# Patient Record
Sex: Female | Born: 1935 | Race: White | Hispanic: No | Smoking: Former smoker
Health system: Southern US, Community
[De-identification: ages and names within clinical notes are randomized; demographics above are authoritative.]

## PROBLEM LIST (undated history)

## (undated) DIAGNOSIS — R001 Bradycardia, unspecified: Secondary | ICD-10-CM

## (undated) DIAGNOSIS — I099 Rheumatic heart disease, unspecified: Secondary | ICD-10-CM

## (undated) DIAGNOSIS — M889 Osteitis deformans of unspecified bone: Secondary | ICD-10-CM

## (undated) DIAGNOSIS — E876 Hypokalemia: Secondary | ICD-10-CM

## (undated) DIAGNOSIS — H9313 Tinnitus, bilateral: Secondary | ICD-10-CM

## (undated) DIAGNOSIS — E785 Hyperlipidemia, unspecified: Secondary | ICD-10-CM

## (undated) DIAGNOSIS — I350 Nonrheumatic aortic (valve) stenosis: Secondary | ICD-10-CM

## (undated) DIAGNOSIS — D329 Benign neoplasm of meninges, unspecified: Secondary | ICD-10-CM

## (undated) DIAGNOSIS — F329 Major depressive disorder, single episode, unspecified: Secondary | ICD-10-CM

## (undated) DIAGNOSIS — I10 Essential (primary) hypertension: Secondary | ICD-10-CM

## (undated) DIAGNOSIS — I4891 Unspecified atrial fibrillation: Secondary | ICD-10-CM

## (undated) DIAGNOSIS — F32A Depression, unspecified: Secondary | ICD-10-CM

## (undated) HISTORY — DX: Unspecified atrial fibrillation: I48.91

## (undated) HISTORY — PX: OTHER SURGICAL HISTORY: SHX169

## (undated) HISTORY — DX: Tinnitus, bilateral: H93.13

## (undated) HISTORY — PX: KNEE ARTHROSCOPY: SUR90

## (undated) HISTORY — PX: HIP SURGERY: SHX245

## (undated) HISTORY — DX: Hypokalemia: E87.6

## (undated) HISTORY — PX: PACEMAKER INSERTION: SHX728

## (undated) HISTORY — DX: Osteitis deformans of unspecified bone: M88.9

## (undated) HISTORY — DX: Major depressive disorder, single episode, unspecified: F32.9

## (undated) HISTORY — PX: ABDOMINAL HYSTERECTOMY: SHX81

## (undated) HISTORY — DX: Nonrheumatic aortic (valve) stenosis: I35.0

## (undated) HISTORY — DX: Bradycardia, unspecified: R00.1

## (undated) HISTORY — PX: CHOLECYSTECTOMY: SHX55

## (undated) HISTORY — DX: Rheumatic heart disease, unspecified: I09.9

## (undated) HISTORY — DX: Benign neoplasm of meninges, unspecified: D32.9

## (undated) HISTORY — DX: Depression, unspecified: F32.A

## (undated) HISTORY — DX: Hyperlipidemia, unspecified: E78.5

## (undated) HISTORY — DX: Essential (primary) hypertension: I10

---

## 1994-09-16 DIAGNOSIS — I099 Rheumatic heart disease, unspecified: Secondary | ICD-10-CM

## 1994-09-16 HISTORY — DX: Rheumatic heart disease, unspecified: I09.9

## 1994-09-16 HISTORY — PX: MITRAL VALVE REPLACEMENT: SHX147

## 1998-05-18 ENCOUNTER — Ambulatory Visit (HOSPITAL_COMMUNITY): Admission: RE | Admit: 1998-05-18 | Discharge: 1998-05-18 | Payer: Self-pay | Admitting: Cardiology

## 1999-11-23 ENCOUNTER — Ambulatory Visit (HOSPITAL_COMMUNITY): Admission: RE | Admit: 1999-11-23 | Discharge: 1999-11-23 | Payer: Self-pay | Admitting: Cardiology

## 2001-03-19 ENCOUNTER — Ambulatory Visit (HOSPITAL_COMMUNITY): Admission: RE | Admit: 2001-03-19 | Discharge: 2001-03-19 | Payer: Self-pay | Admitting: Family Medicine

## 2001-03-19 ENCOUNTER — Encounter: Payer: Self-pay | Admitting: Family Medicine

## 2001-04-27 ENCOUNTER — Encounter: Admission: RE | Admit: 2001-04-27 | Discharge: 2001-05-28 | Payer: Self-pay | Admitting: Neurosurgery

## 2001-07-13 ENCOUNTER — Inpatient Hospital Stay (HOSPITAL_COMMUNITY): Admission: EM | Admit: 2001-07-13 | Discharge: 2001-07-20 | Payer: Self-pay | Admitting: Emergency Medicine

## 2001-07-13 ENCOUNTER — Encounter: Payer: Self-pay | Admitting: Family Medicine

## 2001-07-15 ENCOUNTER — Encounter: Payer: Self-pay | Admitting: Family Medicine

## 2001-07-22 ENCOUNTER — Encounter: Admission: RE | Admit: 2001-07-22 | Discharge: 2001-07-22 | Payer: Self-pay | Admitting: Family Medicine

## 2002-01-11 ENCOUNTER — Ambulatory Visit (HOSPITAL_COMMUNITY): Admission: RE | Admit: 2002-01-11 | Discharge: 2002-01-11 | Payer: Self-pay | Admitting: Cardiology

## 2002-05-28 ENCOUNTER — Ambulatory Visit (HOSPITAL_COMMUNITY): Admission: RE | Admit: 2002-05-28 | Discharge: 2002-05-29 | Payer: Self-pay | Admitting: Cardiology

## 2002-05-29 ENCOUNTER — Encounter: Payer: Self-pay | Admitting: Emergency Medicine

## 2003-03-09 ENCOUNTER — Inpatient Hospital Stay (HOSPITAL_COMMUNITY): Admission: EM | Admit: 2003-03-09 | Discharge: 2003-03-14 | Payer: Self-pay | Admitting: Emergency Medicine

## 2003-04-23 ENCOUNTER — Emergency Department (HOSPITAL_COMMUNITY): Admission: EM | Admit: 2003-04-23 | Discharge: 2003-04-24 | Payer: Self-pay | Admitting: Emergency Medicine

## 2003-05-02 ENCOUNTER — Encounter: Admission: RE | Admit: 2003-05-02 | Discharge: 2003-05-31 | Payer: Self-pay | Admitting: Orthopaedic Surgery

## 2003-11-04 ENCOUNTER — Emergency Department (HOSPITAL_COMMUNITY): Admission: EM | Admit: 2003-11-04 | Discharge: 2003-11-05 | Payer: Self-pay | Admitting: Emergency Medicine

## 2004-07-18 ENCOUNTER — Ambulatory Visit (HOSPITAL_COMMUNITY): Admission: RE | Admit: 2004-07-18 | Discharge: 2004-07-18 | Payer: Self-pay | Admitting: Neurosurgery

## 2004-10-05 ENCOUNTER — Ambulatory Visit (HOSPITAL_COMMUNITY): Admission: RE | Admit: 2004-10-05 | Discharge: 2004-10-05 | Payer: Self-pay | Admitting: Neurosurgery

## 2004-10-10 ENCOUNTER — Inpatient Hospital Stay (HOSPITAL_COMMUNITY): Admission: RE | Admit: 2004-10-10 | Discharge: 2004-10-12 | Payer: Self-pay | Admitting: Neurosurgery

## 2005-03-14 ENCOUNTER — Encounter: Admission: RE | Admit: 2005-03-14 | Discharge: 2005-04-10 | Payer: Self-pay | Admitting: Neurosurgery

## 2005-04-11 ENCOUNTER — Encounter: Admission: RE | Admit: 2005-04-11 | Discharge: 2005-04-26 | Payer: Self-pay | Admitting: Neurosurgery

## 2006-12-22 ENCOUNTER — Encounter: Admission: RE | Admit: 2006-12-22 | Discharge: 2006-12-22 | Payer: Self-pay | Admitting: Neurology

## 2008-06-10 ENCOUNTER — Encounter: Admission: RE | Admit: 2008-06-10 | Discharge: 2008-06-10 | Payer: Self-pay | Admitting: Neurology

## 2009-10-17 ENCOUNTER — Ambulatory Visit: Payer: Self-pay | Admitting: Cardiology

## 2009-11-03 ENCOUNTER — Ambulatory Visit: Payer: Self-pay | Admitting: Cardiology

## 2009-11-17 ENCOUNTER — Ambulatory Visit: Payer: Self-pay | Admitting: Cardiology

## 2009-11-22 ENCOUNTER — Ambulatory Visit: Payer: Self-pay | Admitting: Cardiology

## 2009-11-30 ENCOUNTER — Ambulatory Visit: Payer: Self-pay | Admitting: Cardiology

## 2009-12-15 ENCOUNTER — Ambulatory Visit: Payer: Self-pay | Admitting: *Deleted

## 2009-12-27 ENCOUNTER — Ambulatory Visit: Payer: Self-pay | Admitting: Cardiology

## 2010-01-12 ENCOUNTER — Ambulatory Visit: Payer: Self-pay | Admitting: Cardiology

## 2010-01-12 ENCOUNTER — Encounter: Admission: RE | Admit: 2010-01-12 | Discharge: 2010-01-12 | Payer: Self-pay | Admitting: Endocrinology

## 2010-01-14 ENCOUNTER — Encounter: Payer: Self-pay | Admitting: Internal Medicine

## 2010-01-15 ENCOUNTER — Ambulatory Visit: Payer: Self-pay | Admitting: Internal Medicine

## 2010-01-26 ENCOUNTER — Ambulatory Visit: Payer: Self-pay | Admitting: Cardiology

## 2010-01-31 ENCOUNTER — Encounter: Payer: Self-pay | Admitting: Internal Medicine

## 2010-02-09 ENCOUNTER — Ambulatory Visit: Payer: Self-pay | Admitting: Cardiovascular Disease

## 2010-02-26 ENCOUNTER — Ambulatory Visit: Payer: Self-pay | Admitting: Cardiology

## 2010-03-03 ENCOUNTER — Encounter: Payer: Self-pay | Admitting: Internal Medicine

## 2010-03-29 ENCOUNTER — Ambulatory Visit: Payer: Self-pay | Admitting: Cardiology

## 2010-04-06 ENCOUNTER — Ambulatory Visit: Payer: Self-pay | Admitting: Cardiology

## 2010-04-08 ENCOUNTER — Encounter: Payer: Self-pay | Admitting: Neurosurgery

## 2010-04-11 ENCOUNTER — Ambulatory Visit: Payer: Self-pay | Admitting: Cardiology

## 2010-04-19 NOTE — Letter (Signed)
Summary: Remote Device Check  [facility], Main Office  [address]   [place], [place] [address]   Phone: [phone]  Fax: [phone]     January 31, 2010 MRN: [id number]   ARIANIS BOWDITCH [address] [place], [place]  [address]   Dear Ms. KORMAN,   Your remote transmission was recieved and reviewed by your physician.  All diagnostics were within normal limits for you.   __X____Your next office visit is scheduled for:   3 months with Dr Johney Frame. Please call our office to schedule an appointment.    Sincerely,  Vella Kohler

## 2010-04-19 NOTE — Miscellaneous (Signed)
Summary: Device preload  Clinical Lists Changes  Observations: Added new observation of PPM INDICATN: A-fib (03/03/2010 11:54) Added new observation of MAGNET RTE: BOL 85 ERI 65 (03/03/2010 11:54) Added new observation of PPMLEADSTAT2: active (03/03/2010 11:54) Added new observation of PPMLEADSER2: 161096  (03/03/2010 11:54) Added new observation of PPMLEADMOD2: 4470  (03/03/2010 11:54) Added new observation of PPMLEADDOI2: 05/28/2002  (03/03/2010 11:54) Added new observation of PPMLEADLOC2: RV  (03/03/2010 11:54) Added new observation of PPMLEADSTAT1: active  (03/03/2010 11:54) Added new observation of PPMLEADSER1: 045409  (03/03/2010 11:54) Added new observation of PPMLEADMOD1: 4469  (03/03/2010 11:54) Added new observation of PPMLEADDOI1: 05/28/2002  (03/03/2010 11:54) Added new observation of PPMLEADLOC1: RA  (03/03/2010 11:54) Added new observation of PPM DOI: 05/28/2002  (03/03/2010 11:54) Added new observation of PPM SERL#: [id number]  (03/03/2010 11:54) Added new observation of PPM MODL#: [id number]  (03/03/2010 08:65) Added new observation of PACEMAKERMFG: Medtronic  (03/03/2010 11:54) Added new observation of PPM IMP MD: Duffy Rhody Tennant,MD  (03/03/2010 11:54) Added new observation of PPM REFER MD: Rolla Plate  (03/03/2010 11:54) Added new observation of PACEMAKER MD: Hillis Range, MD  (03/03/2010 11:54)      PPM Specifications Following MD:  Hillis Range, MD     Referring MD:  Rolla Plate PPM Vendor:  Medtronic     PPM Model Number:  [id number]     PPM Serial Number:  [id number] PPM DOI:  05/28/2002     PPM Implanting MD:  Rolla Plate  Lead 1    Location: RA     DOI: 05/28/2002     Model #: [id number]     Serial #: [id number]     Status: active Lead 2    Location: RV     DOI: 05/28/2002     Model #: [id number]     Serial #: [id number]     Status: active  Magnet Response Rate:  BOL 85 ERI 65  Indications:  A-fib

## 2010-04-19 NOTE — Cardiovascular Report (Signed)
Summary: Office Visit Remote   Office Visit Remote   Imported By: Roderic Ovens 01/31/2010 16:23:16  _____________________________________________________________________  External Attachment:    Type:   Image     Comment:   External Document

## 2010-04-23 ENCOUNTER — Other Ambulatory Visit (INDEPENDENT_AMBULATORY_CARE_PROVIDER_SITE_OTHER): Payer: Medicare Other

## 2010-04-23 DIAGNOSIS — I4891 Unspecified atrial fibrillation: Secondary | ICD-10-CM

## 2010-04-23 DIAGNOSIS — Z7901 Long term (current) use of anticoagulants: Secondary | ICD-10-CM

## 2010-04-26 DIAGNOSIS — I4891 Unspecified atrial fibrillation: Secondary | ICD-10-CM | POA: Insufficient documentation

## 2010-04-26 DIAGNOSIS — E785 Hyperlipidemia, unspecified: Secondary | ICD-10-CM | POA: Insufficient documentation

## 2010-04-26 DIAGNOSIS — Z950 Presence of cardiac pacemaker: Secondary | ICD-10-CM | POA: Insufficient documentation

## 2010-04-27 ENCOUNTER — Encounter: Payer: Self-pay | Admitting: Internal Medicine

## 2010-05-02 ENCOUNTER — Encounter (INDEPENDENT_AMBULATORY_CARE_PROVIDER_SITE_OTHER): Payer: Self-pay | Admitting: *Deleted

## 2010-05-04 ENCOUNTER — Ambulatory Visit: Payer: Medicare Other | Admitting: *Deleted

## 2010-05-09 ENCOUNTER — Ambulatory Visit (INDEPENDENT_AMBULATORY_CARE_PROVIDER_SITE_OTHER): Payer: Medicare Other | Admitting: *Deleted

## 2010-05-09 DIAGNOSIS — Z7901 Long term (current) use of anticoagulants: Secondary | ICD-10-CM

## 2010-05-09 DIAGNOSIS — I359 Nonrheumatic aortic valve disorder, unspecified: Secondary | ICD-10-CM

## 2010-05-09 DIAGNOSIS — I4891 Unspecified atrial fibrillation: Secondary | ICD-10-CM

## 2010-05-09 DIAGNOSIS — I059 Rheumatic mitral valve disease, unspecified: Secondary | ICD-10-CM

## 2010-05-09 NOTE — Letter (Signed)
Summary: Appointment - Missed  [facility], Main Office  [address]   [place], [place] [address]   Phone: [phone]  Fax: [phone]     May 02, 2010 MRN: [id number]   SANSA ALKEMA [address] [place], [place]  [address]   Dear Ms. LUEVANOS,  Our records indicate you missed your appointment on  04-27-10  with Dr.  Johney Frame.                                    It is very important that we reach you to reschedule this appointment. We look forward to participating in your health care needs. Please contact us at the number listed above at your earliest convenience to reschedule this appointment.     Sincerely,    Glass blower/designer

## 2010-05-25 ENCOUNTER — Ambulatory Visit (HOSPITAL_COMMUNITY): Payer: Medicare Other | Attending: Cardiology

## 2010-05-25 ENCOUNTER — Ambulatory Visit (INDEPENDENT_AMBULATORY_CARE_PROVIDER_SITE_OTHER): Payer: Medicare Other | Admitting: Nurse Practitioner

## 2010-05-25 DIAGNOSIS — I359 Nonrheumatic aortic valve disorder, unspecified: Secondary | ICD-10-CM | POA: Insufficient documentation

## 2010-05-25 DIAGNOSIS — Z7901 Long term (current) use of anticoagulants: Secondary | ICD-10-CM

## 2010-05-25 DIAGNOSIS — I4891 Unspecified atrial fibrillation: Secondary | ICD-10-CM

## 2010-05-25 DIAGNOSIS — I10 Essential (primary) hypertension: Secondary | ICD-10-CM | POA: Insufficient documentation

## 2010-05-25 DIAGNOSIS — Z950 Presence of cardiac pacemaker: Secondary | ICD-10-CM | POA: Insufficient documentation

## 2010-05-25 DIAGNOSIS — Z954 Presence of other heart-valve replacement: Secondary | ICD-10-CM | POA: Insufficient documentation

## 2010-06-08 ENCOUNTER — Other Ambulatory Visit (INDEPENDENT_AMBULATORY_CARE_PROVIDER_SITE_OTHER): Payer: Medicare Other | Admitting: *Deleted

## 2010-06-08 DIAGNOSIS — I059 Rheumatic mitral valve disease, unspecified: Secondary | ICD-10-CM | POA: Insufficient documentation

## 2010-06-08 DIAGNOSIS — Z7901 Long term (current) use of anticoagulants: Secondary | ICD-10-CM

## 2010-06-08 DIAGNOSIS — I4891 Unspecified atrial fibrillation: Secondary | ICD-10-CM

## 2010-06-20 ENCOUNTER — Encounter: Payer: Medicare Other | Admitting: *Deleted

## 2010-06-25 ENCOUNTER — Encounter: Payer: Medicare Other | Admitting: *Deleted

## 2010-06-26 ENCOUNTER — Ambulatory Visit (INDEPENDENT_AMBULATORY_CARE_PROVIDER_SITE_OTHER): Payer: Medicare Other | Admitting: *Deleted

## 2010-06-26 ENCOUNTER — Other Ambulatory Visit: Payer: Medicare Other | Admitting: *Deleted

## 2010-06-26 DIAGNOSIS — I059 Rheumatic mitral valve disease, unspecified: Secondary | ICD-10-CM

## 2010-06-26 DIAGNOSIS — Z7901 Long term (current) use of anticoagulants: Secondary | ICD-10-CM

## 2010-06-26 DIAGNOSIS — Z954 Presence of other heart-valve replacement: Secondary | ICD-10-CM

## 2010-07-12 ENCOUNTER — Encounter: Payer: Medicare Other | Admitting: Internal Medicine

## 2010-07-13 ENCOUNTER — Other Ambulatory Visit: Payer: Self-pay | Admitting: *Deleted

## 2010-07-13 DIAGNOSIS — I4891 Unspecified atrial fibrillation: Secondary | ICD-10-CM

## 2010-07-13 MED ORDER — DIGOXIN 125 MCG PO TABS: 125.0000 ug | ORAL_TABLET | Freq: Every day | ORAL | Status: DC

## 2010-07-18 ENCOUNTER — Encounter: Payer: Self-pay | Admitting: Internal Medicine

## 2010-07-18 ENCOUNTER — Telehealth: Payer: Self-pay | Admitting: Cardiology

## 2010-07-18 NOTE — Telephone Encounter (Signed)
Has a question about her meds

## 2010-07-18 NOTE — Telephone Encounter (Signed)
Pt was given Digoxin not Lanoxin from pharmacy and would like to know if it is ok to take.  RN notified pt it is ok to take Digoxin in the place of Lanoxin.  Pt verbalized to RN understanding of instructions.

## 2010-07-20 ENCOUNTER — Encounter: Payer: Self-pay | Admitting: Internal Medicine

## 2010-07-20 ENCOUNTER — Ambulatory Visit (INDEPENDENT_AMBULATORY_CARE_PROVIDER_SITE_OTHER): Payer: Medicare Other | Admitting: Internal Medicine

## 2010-07-20 ENCOUNTER — Ambulatory Visit: Payer: Self-pay | Admitting: *Deleted

## 2010-07-20 ENCOUNTER — Ambulatory Visit (INDEPENDENT_AMBULATORY_CARE_PROVIDER_SITE_OTHER): Payer: Medicare Other | Admitting: *Deleted

## 2010-07-20 ENCOUNTER — Encounter: Payer: Medicare Other | Admitting: Internal Medicine

## 2010-07-20 VITALS — BP 102/62 | HR 64 | Ht 67.0 in | Wt 187.0 lb

## 2010-07-20 DIAGNOSIS — E785 Hyperlipidemia, unspecified: Secondary | ICD-10-CM

## 2010-07-20 DIAGNOSIS — R001 Bradycardia, unspecified: Secondary | ICD-10-CM

## 2010-07-20 DIAGNOSIS — Z954 Presence of other heart-valve replacement: Secondary | ICD-10-CM

## 2010-07-20 DIAGNOSIS — I4891 Unspecified atrial fibrillation: Secondary | ICD-10-CM

## 2010-07-20 DIAGNOSIS — I498 Other specified cardiac arrhythmias: Secondary | ICD-10-CM

## 2010-07-20 DIAGNOSIS — I059 Rheumatic mitral valve disease, unspecified: Secondary | ICD-10-CM

## 2010-07-20 DIAGNOSIS — Z7901 Long term (current) use of anticoagulants: Secondary | ICD-10-CM

## 2010-07-20 NOTE — Assessment & Plan Note (Signed)
Continue longterm anticoagulation with coumadin No changes today

## 2010-07-20 NOTE — Progress Notes (Signed)
Annette Short is a pleasant 77 y.o. yo patient with a h/o rheumatic heart disease s/p MVR (mechanical #33 St Jude valve) 1996, permanent atrial fibrillation, and bradycardia sp PPM (MDT) by Dr Deborah Chalk  who presents today to establish care in the Electrophysiology device clinic.   The patient reports doing very well since having a pacemaker implanted and remains very active despite her age.   Today, she  denies symptoms of palpitations, chest pain, shortness of breath, orthopnea, PND, lower extremity edema, dizziness, presyncope, syncope, or neurologic sequela.  The patientis tolerating medications without difficulties and is otherwise without complaint today.   Past Medical History  Diagnosis Date  . Mild aortic stenosis     AVA 1.1cm2 by echo 12/10  . Atrial fibrillation     permanent  . Depression   . Hyperlipidemia   . Rheumatic heart disease 09/1994    s/p mechanical mitral valve repair (#33 St Jude valve)  . Tinnitus of both ears   . Paget's bone disease     skull  . Hypertension   . Meningioma   . Bradycardia     s/p PPM    Past Surgical History  Procedure Date  . Pacemaker insertion   . Right ankle surgery     with pin and plate  . Abdominal hysterectomy     total  . Cholecystectomy   . Knee arthroscopy     total  . Mitral valve replacement 09/1994    #33 St Jude valve    History   Social History  . Marital Status: Widowed    Spouse Name: N/A    Number of Children: N/A  . Years of Education: N/A   Occupational History  . Not on file.   Social History Main Topics  . Smoking status: Never Smoker   . Smokeless tobacco: Not on file  . Alcohol Use: No  . Drug Use: No  . Sexually Active: Not on file   Other Topics Concern  . Not on file   Social History Narrative   widow    No family history on file.  Allergies  Allergen Reactions  . Penicillins   . Warfarin And Related     Current Outpatient Prescriptions  Medication Sig Dispense Refill  .  digoxin (LANOXIN) 0.125 MG tablet Take 1 tablet (125 mcg total) by mouth daily.  30 tablet  6  . FUROSEMIDE PO Take by mouth.        Marland Kitchen KLOR-CON M10 10 MEQ tablet daily.      . metoprolol (TOPROL-XL) 50 MG 24 hr tablet daily.      Marland Kitchen PARoxetine (PAXIL) 40 MG tablet Take 40 mg by mouth every morning.        . warfarin (COUMADIN) 5 MG tablet Take 5 mg by mouth as directed.        Marland Kitchen DISCONTD: Potassium (POTASSIMIN PO) Take by mouth.          ROS- all systems are reviewed and negative except as per HPI  Physical Exam: Filed Vitals:   07/20/10 1006  BP: 102/62  Pulse: 64  Height: 5\' 7"  (1.702 m)  Weight: 187 lb (84.823 kg)    GEN- The patient is elderly appearing, alert and oriented x 3 today.   Head- normocephalic, atraumatic Eyes-  Sclera clear, conjunctiva pink Ears- hearing intact Oropharynx- clear Neck- supple, no JVP Lymph- no cervical lymphadenopathy Lungs- Clear to ausculation bilaterally, normal work of breathing Chest- pacemaker pocket is well healed Heart-  Regular rate and rhythm, mechanical S1, no murmurs, rubs or gallops, PMI not laterally displaced GI- soft, NT, ND, + BS Extremities- no clubbing, cyanosis, or edema MS- no significant deformity or atrophy Skin- no rash or lesion Psych- euthymic mood, full affect Neuro- strength and sensation are intact  Pacemaker interrogation- reviewed in detail today,  See PACEART report  Assessment and Plan:

## 2010-07-20 NOTE — Patient Instructions (Signed)
Your physician wants you to follow-up in: 12 months in [place] office You will receive a reminder letter in the mail two months in advance. If you don't receive a letter, please call our office to schedule the follow-up appointment.

## 2010-07-20 NOTE — Assessment & Plan Note (Signed)
Normal pacemaker function See Pace Art report No changes today  

## 2010-07-20 NOTE — Assessment & Plan Note (Signed)
Stable No change required today  

## 2010-07-20 NOTE — Assessment & Plan Note (Signed)
Doing well s/p MVR (mechanical) Continue lifelong anticoagulation with coumadin.

## 2010-07-23 ENCOUNTER — Telehealth: Payer: Self-pay | Admitting: Cardiology

## 2010-07-23 NOTE — Telephone Encounter (Signed)
Patient needs dosage information for coumadin.  Please call

## 2010-07-23 NOTE — Telephone Encounter (Signed)
Coumadin dosage discussed with patient

## 2010-08-03 ENCOUNTER — Ambulatory Visit (INDEPENDENT_AMBULATORY_CARE_PROVIDER_SITE_OTHER): Payer: Medicare Other | Admitting: *Deleted

## 2010-08-03 DIAGNOSIS — Z954 Presence of other heart-valve replacement: Secondary | ICD-10-CM

## 2010-08-03 DIAGNOSIS — Z7901 Long term (current) use of anticoagulants: Secondary | ICD-10-CM

## 2010-08-03 DIAGNOSIS — I059 Rheumatic mitral valve disease, unspecified: Secondary | ICD-10-CM

## 2010-08-03 LAB — POCT INR: INR: 4.5

## 2010-08-03 NOTE — Op Note (Signed)
[place]  Patient:    Annette Short, Annette Short Visit Number: [id number] MRN: [id number]          Service Type: MED Location: [id number] Attending Physician:  McDiarmid, Leighton Roach. Dictated by:   Lubertha Basque Jerl Santos, M.D. Proc. Date: 07/16/01 Admit Date:  07/13/2001                             Operative Report  PREOPERATIVE DIAGNOSIS:  Left knee infected prepatellar bursa.  POSTOPERATIVE DIAGNOSIS:  Left knee infected prepatellar bursa.  PROCEDURE:  Left knee irrigation and debridement, prepatellar bursa.  ANESTHESIA:  General.  SURGEON:  Lubertha Basque. Jerl Santos, M.D.  ASSISTANT:  Prince Rome, P.A.  INDICATION FOR PROCEDURE:  The patient is a 77 year old woman on Coumadin for a valve replacement.  Several days ago she fell directly on her knee and experienced massive swelling of her knee.  She was noted to have an extremely elevated INR.  She was subsequently admitted to the medicine service with a very swollen knee.  There was some concern about an infection in the knee joint.  An attempted aspiration was made, and 2 cc of blood were returned. She has persisted with pain on the anterior aspect of her knee despite rest, elevation, and IV Levaquin antibiotic.  At this point she is offered incision and drainage with operative removal of the bursa and any fluid.  The procedure was discussed with the patient, and informed operative consent was obtained after discussion of the possible complications of, reaction to anesthesia, and infection.  DESCRIPTION OF PROCEDURE:  The patient was taken to the operating suite, where a general anesthetic was applied without difficulty.  She was positioned supine and prepped and draped in the normal sterile fashion.  After administration of preop IV Ancef, her surgery was performed.  She was given a test dose of this medicine with no reaction, then given the entire dose with no problems.  No tourniquet was used  during the place; one was placed but never inflated.  A longitudinal incision was made over the prepatellar area. She had a very large collection of clotted blood.  Perhaps a cup of blood was removed.  No real pus was encountered, but cultures were taken and sent to the lab.  A thorough irrigation and debridement was done.  We used the pulsatile lavage as well.  She did not have much in the way of active bleeding occurring, but the Bovie was used to control any small bleeders.  I used some Prolene sutures to go through the skin and into the tissues in the retinaculum and then back through the skin in order to reapproximate the tissue and eliminate any dead space.  I placed five or six of these around the prepatellar area.  I then placed a Penrose drain and closed the incision with Prolene.  Adaptic was applied to the wound, followed by dry gauze and a loose Ace wrap.  Estimated blood loss and intraoperative fluids can be obtained from the anesthesia records.  DISPOSITION:  The patient was extubated in the operating room and taken to the recovery room in stable condition.  Plans were for her to be admitted back to the medicine service, who will monitor her closely on IV Kefzol, and she may restart her Coumadin immediately. Dictated by:   Lubertha Basque Jerl Santos, M.D. Attending Physician:  Acquanetta Belling D. DD:  07/16/01 TD:  07/17/01 Job:  [id number]

## 2010-08-03 NOTE — Discharge Summary (Signed)
NAME:  Annette Short, Annette Short                          ACCOUNT NO.:  [id number]   MEDICAL RECORD NO.:  [id number]                   PATIENT TYPE:  INP   LOCATION:  5004                                 FACILITY:  [facility]   PHYSICIAN:  Lubertha Basque. Jerl Santos, M.D.             DATE OF BIRTH:  1933/05/26   DATE OF ADMISSION:  03/09/2003  DATE OF DISCHARGE:  03/14/2003                                 DISCHARGE SUMMARY   ADMISSION DIAGNOSES:  1. Right ankle trimalleolar fracture.  2. History of coronary bypass.  3. History of pacemaker.   DISCHARGE DIAGNOSES:  1. Right ankle trimalleolar fracture.  2. History of coronary bypass.  3. History of pacemaker.   PROCEDURE PERFORMED:  Right ankle ORIF.   BRIEF HISTORY:  [name] is a 77 year old female who had a knee operation  a few years ago but this time she had fallen on the day of admission to the  hospital. She was having difficulty walking on her right ankle with  deformity and pain.  She was transported to the emergency room at which time  x-rays revealed a trimalleolar ankle fracture.  We discussed treatment  options with the patient, those being open reduction internal fixation.   PERTINENT LABORATORY AND X-RAY FINDINGS:  She had a trimalleolar ankle  fracture on the right side on x-ray.  Chest with cardiomegaly but no active  disease.  Pacer electrodes were noted.  Lab work, last testing, WBC is 2.94,  hemoglobin 9.1, hematocrit 26.7. INR 2.0.  Pro time 19.1.  Blood was  replaced as necessary during her hospital stay.   HOSPITAL COURSE:  She was admitted and placed on her normal medications  which are Coumadin, Paxil, Cozaar, Lasix, K-Dur, Lanoxin, Zetia, Zantac,  Toprol and postoperative we used ice and elevation.  She was given  appropriate pain medications, IV and p.o.  IV Ancef 1 gram q.8h. x3 doses.  Physical therapy was ordered and eventually an OT consult was also ordered  to be touch down to nonweight bearing on the right side.   Pharmacy was  helpful in using heparin for anticoagulation and then getting her back on  her Coumadin.  Dressing was changed and she was placed into a CAM boot. The  wound was noted to be benign and no sign of irritation or infection and she  was discharged home.   CONDITION ON DISCHARGE:  Improved.   FOLLOW UP:  She will remain on Vicodin for pain, one or two q.4-6h. p.r.n.  discomfort, Coumadin 5 mg take two tablets daily and contact cardiologist to  adjust the dose. She is also left on her home medications which are Paxil 40  mg q.a.m.,  Cozaar 50 mg q.d.,  Lasix 40 mg q.d.,  K-Dur 10 mEq q.d.,  Lanoxin 0.125 mg q.d.,  Zetia 10 mg q.d.,  Zantac 150 mg b.i.d., Toprol XL  q.d.  She is to be nonweight  bearing on her right foot with a walker,  crutches or wheelchair at all times.  The boot should remain on.  They can  change the dressing every other day to every three  days.  We will see her back in our office in seven to 10 days.  Call [phone] for an appointment.  Her diet is unrestricted.  Any signs of increasing  temperature above normal, fever or redness, she is to call and we would see  her in our office immediately.      Lindwood Qua, P.A.                    Lubertha Basque Jerl Santos, M.D.    MC/MEDQ  D:  04/19/2003  T:  04/19/2003  Job:  [id number]

## 2010-08-03 NOTE — H&P (Signed)
NAMEALEXINA, Annette Short                ACCOUNT NO.:  [id number]   MEDICAL RECORD NO.:  [id number]          PATIENT TYPE:  INP   LOCATION:  2899                         FACILITY:  [facility]   PHYSICIAN:  Hilda Lias, M.D.   DATE OF BIRTH:  1933-07-04   DATE OF ADMISSION:  10/10/2004  DATE OF DISCHARGE:                                HISTORY & PHYSICAL   HISTORY:  Ms. Boakye is a lady who has been complaining of back pain with  radiation down to the leg for many years.  This lady eventually was seen by  me initially in 2003.  The pain is getting worse.  She feels that she has  had more pain and weakness in the left leg, and she is quite miserable.  She  denies any pain in the right leg.  She has an x-ray and was sent to [place] for  an evaluation.   PAST MEDICAL HISTORY:  1.  Cholecystectomy.  2.  Hysterectomy.  3.  Mitral valve replacement.  4.  Knee surgery.  5.  A pacemaker.  6.  Surgery of the right foot.   ALLERGIES:  PENICILLIN.   REVIEW OF SYSTEMS:  Positive for back and left leg pain.   PHYSICAL EXAMINATION:  HEENT:  Normal.  NECK:  Normal.  LUNGS:  Clear.  HEART:  There is a pacemaker plus a midline scar.  ABDOMEN:  Normal.  EXTREMITIES:  Grade 1 edema.  NEUROLOGIC:  Mental status normal.  Cranial nerves normal.  Strength is 5/5  except in the left leg where she has weakness of dorsiflexion on the left  foot with numbness.   The MRI showed that she has a herniated disk at the level of L4-5,  compromising the L5 nerve root, as well as facet arthropathy on the left at  the level of L5-S1.   CLINICAL IMPRESSION:  1.  Left L4-L5 herniated disk.  2.  Left L5-S1 stenosis.   RECOMMENDATIONS:  The patient is being admitted for surgery.  The procedure  will be a left L4-5 and L5-S1 diskectomy with foraminotomy.  She knows about  the risks such as infection, CSF leak, worsening of the pain, paralysis, and  all the risks associated with her being off of Coumadin.       EB/MEDQ  D:  10/10/2004  T:  10/10/2004  Job:  [id number]

## 2010-08-03 NOTE — H&P (Signed)
[place]. [facility]  Patient:    ARLISA, LECLERE Visit Number: [id number] MRN: [id number]          Service Type: MED Location: [phone] Attending Physician:  McDiarmid, Leighton Roach. Dictated by:   Michell Heinrich, M.D. Admit Date:  07/13/2001                           History and Physical  CHIEF COMPLAINT: Left leg pain.  HISTORY OF PRESENT ILLNESS: Ms. Hayter is a 77 year old white female, with an eight day history of left knee pain.  She had an fall about eight days ago and reportedly scraped her knee and had minimal swelling and discoloration of the knee at the time.  She was able to walk on it without difficulty.  She had some bruising and swelling over the area with development of erythema and warmth over the next few days, and went to her primary care physician.  At this visit she had a negative x-ray and her INR was found to be supratherapeutic (unknown number), and she was instructed to hold her Coumadin until day and she was not given any further medications.  The night prior to presentation she had increased swelling of her left foot and ankle and decided to come to the emergency department today.  PAST MEDICAL HISTORY:  1. Paroxysmal atrial fibrillation.  2. Mitral valve replacement (mitral/mechanical valve).  3. Neurologic symptoms.  Reports intermittent loss of balance and some     related nausea.  Reports that these have been related to a "benign" mass     in her brain, for which she sees Dr. Sandria Manly.  4. Low back pain.  She has a history of herniated nucleus pulposus but     has not had surgery.  She apparently sees Dr. Jeral Fruit.  PAST SURGICAL HISTORY:  1. Cardiac surgery, mitral valve replacement for rheumatic heart disease in     1996.  2. Hysterectomy/BSO.  3. Cholecystectomy.  MEDICATIONS:  1. Coumadin 5 mg p.o. q.d., which is her latest dosing.  2. Lanoxin 0.125 mg q.d.  3. Cozaar 50 mg q.d.  4. K-Dur 10 mEq p.o. q.d.  5.  Zantac 150 mg b.i.d. p.r.n.  6. Cordarone 200 mg p.o. q.d.  7. Lipitor 40 mg p.o. q.d.  8. Lasix 40 mg p.o. q.d.  9. Zetia 10 mg p.o. q.d. 10. Celebrex 200 mg p.o. q.d. 11. Paxil 40 mg p.o. q.d. 12. Vicodin 5/500 mg one to two tablets p.o. q.6h p.r.n. pain. 13. Phenergan 25 mg q.8h p.r.n.  ALLERGIES: PENICILLIN causes anaphylaxis.  SOCIAL HISTORY/HABITS: She is married and lives [place] her [place] in [place]  She is a retired Scientist, research (physical sciences).  She is independent of all of her activities of daily living but does not drive alone. She has an approximate ten-pack-year history of tobacco abuse but quit in the 1970s.  Denies alcohol or drug use.  FAMILY HISTORY: Mother died at age 36 of breast cancer.  Father died at age 82 with myocardial infarction.  She has a sister with type 2 diabetes.  REVIEW OF SYSTEMS: She has dyspnea on exertion with 30 feet of walking.  She has occasional palpitations.  No fever or chills, nausea or vomiting.  No pain in the contralateral leg.  PHYSICAL EXAMINATION:  VITAL SIGNS: Temperature 96.9 degrees, pulse 61, blood pressure 133/54, respirations 20.  O2 saturation 98% on room air.  GENERAL: No  acute distress, alert and oriented x4.  HEENT: PERRLA.  EOMI.  TMs within normal limits bilaterally.  No icterus or injection.  Oropharynx with pink moist mucosa.  She is edentulous and does not have any oral lesions.  NECK: Supple without lymphadenopathy or thyromegaly.  LUNGS: Clear to auscultation bilaterally.  Nonlabored respirations.  CARDIOVASCULAR: Regular rhythm and rate with a loud S1 click.  No murmurs, rubs, or gallops.  ABDOMEN: Soft, nontender, nondistended.  Bowel sounds normoactive.  No hepatosplenomegaly.  EXTREMITIES: Scattered varicosities with lipomatous-like masses on the upper extremities and lower extremities bilaterally.  Her left knee has a notable effusion and is erythematous and warm, with a superficial  abrasion present with some fluid drainage.  Warmth extends from the mid tibia to the upper thigh and she has diffuse nonpalpable purpura on the left lower extremity from the thigh to the ankle.  The left and right thighs both measure 56 cm.  The left calf is noticeably enlarged compared to the right, and there is 3+ pitting edema of the left lower extremity below the knee.  There is tenderness to palpation over most of the left lower extremity but exquisitely tender over the left knee cap.  LABORATORY DATA: Sodium 140, potassium 4.1, chloride 106, bicarbonate 28, BUN 10, creatinine 0.9, glucose 98, calcium 8.7.  WBC 6.6, hemoglobin 10.9, platelets 244,000; MCV 89.  INR is 2.0, PTT 46.  ASSESSMENT/PLAN: [age] year white female with recent fall and left knee abrasion, now with apparent cellulitis and questionable hemarthrosis versus monoarticular arthritis.  1. Left knee swelling.  Question whether there is cellulitis involved but     there definitely appears to be a knee effusion.  We tapped the knee in the     emergency department and this revealed 2-3 cc of dark blood without any     further fluid.  This fluid will be sent for culture.  We will also send     blood cultures and will need to begin prophylactic antibiotics until     further results obtained.  Will need to rule out deep vein thrombosis if     possible with lower extremity Dopplers.  Will consult orthopedics to get     their opinion on possible further surgical treatment for the knee.  2. Chronic anticoagulation.  Her INR is therapeutic now but with hemarthrosis     will hold further anticoagulants and see if she needs surgery.  3. History of fall.  Sounds like a legitimate accident, without any     neurologic precipitant, but could have been related to her intermittent     bouts of imbalance and nausea.  Will need to monitor and try to follow up     her neurologic work-up.  4. Cardiac.  Stable, regular rhythm.  No  electrocardiogram for now.  Will      continue outpatient medicines.  No telemetry for now.Dictated by:   Michell Heinrich, M.D. Attending Physician:  McDiarmid, Tawanna Cooler D. DD:  07/13/01 TD:  07/14/01 Job: [id number]

## 2010-08-03 NOTE — Op Note (Signed)
Annette Short, Annette Short                ACCOUNT NO.:  [id number]   MEDICAL RECORD NO.:  [id number]          PATIENT TYPE:  INP   LOCATION:  3010                         FACILITY:  [facility]   PHYSICIAN:  Hilda Lias, M.D.   DATE OF BIRTH:  1934/03/09   DATE OF PROCEDURE:  10/10/2004  DATE OF DISCHARGE:                                 OPERATIVE REPORT   PREOPERATIVE DIAGNOSIS:  Left L4-5 herniated disk, left L5-S1 stenosis,  foraminal, degenerative disk disease.   POSTOPERATIVE DIAGNOSIS:  Left L4-5 herniated disk, left L5-S1 stenosis,  foraminal, degenerative disk disease.   PROCEDURE:  Left L5 hemilaminectomy, left L4-5 diskectomy, left L5-S1  foraminotomy, microscopic.   SURGEON:  Hilda Lias, M.D.   ASSISTANT:  Danae Orleans. Venetia Maxon, M.D.   CLINICAL HISTORY:  Patient was admitted because of back and left leg pain.  X-rays show severe degenerative disk disease with a herniated disk, L4-5,  left, and L5-S1 _____.  The patient wanted to proceed with surgery because  she was not any better with conservative treatment.   PROCEDURE:  Patient was taken to the OR.  The back was prepped with  Betadine.  A midline incision from L4-S1 was made.  The muscle was retracted  laterally.  With the microscope, we drilled the hemilamina of L5 CDU.  There  was a thick yellow ligament which also was excised.  The S1 nerve root was  quite tight, and foraminotomy using Kerrison punch was accomplished.  The  thecal sac was retracted, and we visualized the L4-5, which was herniated.  An incision was made, and a large amount of degenerative disk was removed  medially and laterally.  From then on, the area was irrigated.  Fentanyl and  Depo-Medrol space, and the wound was closed with Vicryl and a Steri-Strip.       EB/MEDQ  D:  10/10/2004  T:  10/10/2004  Job:  [id number]

## 2010-08-03 NOTE — Op Note (Signed)
   NAME:  STORMIE, VENTOLA                          ACCOUNT NO.:  [id number]   MEDICAL RECORD NO.:  [id number]                   PATIENT TYPE:  OIB   LOCATION:  2020                                 FACILITY:  [facility]   PHYSICIAN:  Colleen Can. Deborah Chalk, M.D.            DATE OF BIRTH:  Aug 16, 1933   DATE OF PROCEDURE:  05/28/2002  DATE OF DISCHARGE:                                 OPERATIVE REPORT   PROCEDURE:  Implantation of a dual-chamber pulse generator under  fluoroscopy.   INDICATIONS FOR PROCEDURE:  Bradycardia with history of atrial fibrillation,  prosthetic mitral valve.   DESCRIPTION OF PROCEDURE:  The right subclavicular area was prepped and  draped. This area was infiltrated with 1% Xylocaine.  A subcutaneous pocket  was created through the prepectoral fascia.   Two punctures were made into the right subclavian vein over top of the first  rib.  The 7 French Cook introducers were used to introduce the atrial and  ventricular leads.  The ventricular lead was a Guidant l 4470 active  fixation lead, serial #[id number].  Ventricular thresholds were 0.2 volts to  capture at 0.4 mA current with 0.5 msec pulse width.  Impedance was 598  ohms, R waves were 21.7 mV.   The atrial lead was a Guidant 4469 active fixation lead, serial #[id number]  [id number].  Atrial thresholds were 1.3 volts to capture, 3.2 mA current with a  0.5 msec pulse width.  Impedance was 398 ohms and P waves were 3.1 mV.  The  leads were sutured in place. The wound was flushed with kanamycin solution.  The leads were connected to a Medtronic DDDR Kappa [id number] pacemaker, serial  [id number].  The unit was sewn in place.  The wound was closed with 2-0 and  subsequently 5-0 Dexon and Steri-Strips were applied.                                                 Colleen Can. Deborah Chalk, M.D.    SNT/MEDQ  D:  05/28/2002  T:  05/29/2002  Job:  [id number]

## 2010-08-03 NOTE — H&P (Signed)
NAME:  Annette Short, Annette Short NO.:  [id number]   MEDICAL RECORD NO.:  [id number]                   PATIENT TYPE:  OIB   LOCATION:                                       FACILITY:  [facility]   PHYSICIAN:  Colleen Can. Deborah Chalk, M.D.            DATE OF BIRTH:  1933-10-17   DATE OF ADMISSION:  05/28/2002  DATE OF DISCHARGE:                                HISTORY & PHYSICAL   CHIEF COMPLAINT:  Weakness and fatigue with subsequent documentation of  tachy-brady syndrome.   HISTORY OF PRESENT ILLNESS:  The patient is a very pleasant 77 year old  white female who has multiple medical problems. She presents with ongoing  problems with weakness and fatigue. She has had previous bouts of recurrent  atrial fibrillation and her amiodarone had been stopped in the latter part  of 2003. She has remained on chronic Coumadin. She has had a recent Holter  monitor which demonstrated slow sinus bradycardia. In light of  these  findings, she is now referred on for implantation of a permanent pacemaker  with plans to reinitiate anti-arrhythmic therapy. She has not had any  recurrent chest pain. Her shortness of breath has improved since being off  of amiodarone.   PAST MEDICAL HISTORY:  1. Severe mitral stenosis with mild mitral regurgitation, status post mitral     valve replacement in July 1996 by Dr. Kathlee Nations Trigt with a 33-mm St.     Jude valve.  2. Prior history of rheumatic heart disease.  3. Cardiomyopathy with cardiac catheterization in May 1996 showing normal     coronary arteries. Her last 2D echocardiogram was in November 2003 which     showed an estimated ejection fraction at 40% to 45%, atrial fibrillation,     left ventricular enlargement, akinetic septum, satisfactory functioning     of the prosthetic mitral valve, biatrial enlargement and trace tricuspid     regurgitation with mild pulmonic insufficiency.  4. Depression.  5. History of atrial fibrillation,  intolerant to quinidine and previously     maintained on amiodarone, however, stopped in the latter part of 2003.  6. Hypercholesteremia.  7. Chronic Coumadin therapy.  8. Tinnitus.  9. History of Paget's disease of the skull.  10.      History of meningioma.  11.      Obesity.  12.      Remote history of tobacco abuse.   ALLERGIES:  PENICILLIN WHICH CAUSES ANAPHYLAXIS.   CURRENT MEDICATIONS:  1. Potassium 10 mEq daily.  2. Celebrex 200 mg daily.  3. Hydrocodone p.r.n.  4. Lasix 40 mg a day.  5. Lipitor 40 mg a day.  6. Paxil 40 mg a day.  7. Lanoxin 0.125 daily.  8. Zantac p.r.n.  9. Coumadin 5 x4, 2.5 x3.  10.      Cozaar 50 mg a day.  11.  Toprol  XL 25 mg a day.   PAST SURGICAL HISTORY:  1. Status post cholecystectomy.  2. Status post hysterectomy.  3. Previous lipoma excision.   SOCIAL HISTORY:  There is no present alcohol or tobacco.   FAMILY HISTORY:  The mother died of breast cancer, father died with a heart  attack. She has had 1 sister who has also had cardiomyopathy and died in the  postoperative phase following valve surgery.   REVIEW OF SYSTEMS:  As noted above. Since being off of amiodarone her  shortness of breath has improved. She continues to have a chronic degree of  depression. She has had no reoccurrence of chest pain. She has had previous  bout of significant URI towards the first part of the year which has been  treated with antibiotics.   PHYSICAL EXAMINATION:  GENERAL:  She is a pleasant white female who appears  somewhat older than her stated age. Her mood is somewhat somber. Weight is  222 pounds.  VITAL SIGNS:  Blood pressure 130/60 sitting and standing, heart rate 44 and  regular, respirations 18, she is afebrile.  SKIN:  Warm and dry. Color is somewhat sallow.  LUNGS:  Clear.  HEART:  Shows a reasonably regular rhythm with a slow ventricular response.  ABDOMEN:  Soft, positive bowel sounds and obese.  EXTREMITIES:  Without edema.   NEUROLOGIC:  Intact, no gross focal deficits.   LABORATORY DATA:  Pending.   IMPRESSION:  1. Probable tachy-brady syndrome.  2. History of atrial fibrillation.  3. Known mitral valve disease, status post mitral valve replacement in 1996.  4. Chronic Coumadin.  5. Chronic depression.   PLAN:  Will proceed on with elective permanent pacemaker implantation. Will  try to maintain the patient in sinus rhythm if at all possible. May need to  reinitiate anti-arrhythmic therapy.      Juanell Fairly C. Earl Gala, N.P.                 Colleen Can. Deborah Chalk, M.D.    LCO/MEDQ  D:  05/24/2002  T:  05/24/2002  Job:  [id number]

## 2010-08-03 NOTE — Op Note (Signed)
NAME:  Annette Short, Annette Short                          ACCOUNT NO.:  [id number]   MEDICAL RECORD NO.:  [id number]                   PATIENT TYPE:  INP   LOCATION:  5004                                 FACILITY:  [facility]   PHYSICIAN:  Lubertha Basque. Jerl Santos, M.D.             DATE OF BIRTH:  26-Oct-1933   DATE OF PROCEDURE:  03/10/2003  DATE OF DISCHARGE:                                 OPERATIVE REPORT   PREOPERATIVE DIAGNOSIS:  Right ankle trimalleolar fracture.   POSTOPERATIVE DIAGNOSIS:  Right ankle trimalleolar fracture.   PROCEDURE:  Open reduction and internal fixation right ankle fracture.   ANESTHESIA:  General.   SURGEON:  Lubertha Basque. Jerl Santos, M.D.   ASSISTANT:  Lindwood Qua, P.A.   INDICATIONS FOR PROCEDURE:  The patient is a 77 year old woman who slipped  on some sleeves yesterday and sustained a trimalleolar fracture dislocation  of her right ankle. She was seen in the emergency room and evaluated. She  was found to have a pro time of over 19 with an INR of 2.0.  She was on  chronic Coumadin for a mitral valve replacement of metal. She was given 1  unit of FFP and set up for ORIF of her right ankle.  I consulted with her  cardiologist preoperatively over the phone.  Informed operative consent was  obtained after discussion of the possible complications of reaction to  anesthesia and infection as well as degenerative change in the ankle.   DESCRIPTION OF PROCEDURE:  The patient was taken to an operative suite where  general anesthetic was applied without difficulty.  She was positioned  supine and prepped and draped in normal sterile fashion.  After  administration of preop IV antibiotics, the right leg was elevated,  exsanguinated, and tourniquet inflated about the calf. A lateral incision  was made along the fibula, centered at the fracture site.  The fracture was  exposed well and reduced anatomically with fracture reduction clamps.  Fluoroscopy was used to confirm adequate  reduction of the lateral aspect of  her ankle and this was then stabilized with a six hole 1/3 tubular side  plate.  I used all six holes.  Attention was then turned toward the medial  aspect.  A separate incision was made there with dissection down to a very  large medial fragment.  This was reduced anatomically and stabilized with  two partially threaded cancellous 4.0 mm screws from the [employer] set. These  were both 45 mm in length.  Fluoroscopy was then used in two planes to  confirm adequate placement of hardware and reduction of the fractures. The  posterior malleolar fragment lined up well and no hardware was required  there. This constituted about 10% of the joint.  I then did stress the  syndesmosis under fluoroscopy and this was found to be stable so no  additional fixation was needed for the syndesmosis. The wounds were  thoroughly  irrigated followed by reapproximation of deep tissues with 2-0  undyed Vicryl and the skin with staples. The tourniquet was deflated and the  foot became pink and warm medially. Some Marcaine was injected about the  incision site. Adaptic was applied to the wound followed by dry gauze and a  posterior splint of plaster with the ankle in neutral position.  Estimated  blood loss, intraoperative fluids as well as accurate tourniquet time can be  obtained from anesthesia records.   DISPOSITION:  The patient was extubated in the operating room and taken to  the recovery room in stable condition.  Plans were for her to be admitted  back through the orthopedic surgery service for appropriate postop care to  include perioperative antibiotics and resumption of her Coumadin along with  heparin.                                               Lubertha Basque Jerl Santos, M.D.    PGD/MEDQ  D:  03/10/2003  T:  03/11/2003  Job:  [id number]

## 2010-08-12 ENCOUNTER — Other Ambulatory Visit: Payer: Self-pay | Admitting: Cardiology

## 2010-08-14 ENCOUNTER — Encounter: Payer: Self-pay | Admitting: Nurse Practitioner

## 2010-08-24 ENCOUNTER — Ambulatory Visit (INDEPENDENT_AMBULATORY_CARE_PROVIDER_SITE_OTHER): Payer: Medicare Other | Admitting: *Deleted

## 2010-08-24 DIAGNOSIS — I059 Rheumatic mitral valve disease, unspecified: Secondary | ICD-10-CM

## 2010-08-24 DIAGNOSIS — Z954 Presence of other heart-valve replacement: Secondary | ICD-10-CM

## 2010-08-24 DIAGNOSIS — Z7901 Long term (current) use of anticoagulants: Secondary | ICD-10-CM

## 2010-09-07 ENCOUNTER — Ambulatory Visit (INDEPENDENT_AMBULATORY_CARE_PROVIDER_SITE_OTHER): Payer: Medicare Other | Admitting: *Deleted

## 2010-09-07 ENCOUNTER — Other Ambulatory Visit: Payer: Self-pay | Admitting: *Deleted

## 2010-09-07 DIAGNOSIS — I059 Rheumatic mitral valve disease, unspecified: Secondary | ICD-10-CM

## 2010-09-07 DIAGNOSIS — Z7901 Long term (current) use of anticoagulants: Secondary | ICD-10-CM

## 2010-09-07 DIAGNOSIS — Z954 Presence of other heart-valve replacement: Secondary | ICD-10-CM

## 2010-09-07 LAB — POCT INR: INR: 2

## 2010-09-07 MED ORDER — WARFARIN SODIUM 5 MG PO TABS: ORAL_TABLET | ORAL | Status: DC

## 2010-09-21 ENCOUNTER — Encounter: Payer: Medicare Other | Admitting: *Deleted

## 2010-09-26 ENCOUNTER — Other Ambulatory Visit: Payer: Self-pay | Admitting: *Deleted

## 2010-09-26 MED ORDER — WARFARIN SODIUM 5 MG PO TABS: ORAL_TABLET | ORAL | Status: DC

## 2010-09-26 NOTE — Telephone Encounter (Signed)
Pt will pick up handicap form on Friday when she comes to get labs. Also will send generic coumadin refill in to [facility].

## 2010-09-26 NOTE — Telephone Encounter (Signed)
CALL TO GET HANIDICAPP STICKER PAPER RENEWED. REFILL GENERIC FOR COUMADIN.

## 2010-09-28 ENCOUNTER — Encounter: Payer: Medicare Other | Admitting: *Deleted

## 2010-10-02 ENCOUNTER — Ambulatory Visit (INDEPENDENT_AMBULATORY_CARE_PROVIDER_SITE_OTHER): Payer: Medicare Other | Admitting: *Deleted

## 2010-10-02 DIAGNOSIS — Z954 Presence of other heart-valve replacement: Secondary | ICD-10-CM

## 2010-10-02 DIAGNOSIS — Z7901 Long term (current) use of anticoagulants: Secondary | ICD-10-CM

## 2010-10-02 DIAGNOSIS — I059 Rheumatic mitral valve disease, unspecified: Secondary | ICD-10-CM

## 2010-10-15 ENCOUNTER — Other Ambulatory Visit: Payer: Self-pay | Admitting: *Deleted

## 2010-10-15 DIAGNOSIS — I059 Rheumatic mitral valve disease, unspecified: Secondary | ICD-10-CM

## 2010-10-15 DIAGNOSIS — R001 Bradycardia, unspecified: Secondary | ICD-10-CM

## 2010-10-15 DIAGNOSIS — I4891 Unspecified atrial fibrillation: Secondary | ICD-10-CM

## 2010-10-15 DIAGNOSIS — E785 Hyperlipidemia, unspecified: Secondary | ICD-10-CM

## 2010-10-15 MED ORDER — METOPROLOL SUCCINATE ER 50 MG PO TB24: 50.0000 mg | ORAL_TABLET | Freq: Every day | ORAL | Status: DC

## 2010-10-18 ENCOUNTER — Encounter: Payer: Medicare Other | Admitting: *Deleted

## 2010-10-23 ENCOUNTER — Encounter: Payer: Self-pay | Admitting: *Deleted

## 2010-11-02 ENCOUNTER — Ambulatory Visit (INDEPENDENT_AMBULATORY_CARE_PROVIDER_SITE_OTHER): Payer: Medicare Other | Admitting: *Deleted

## 2010-11-02 DIAGNOSIS — Z954 Presence of other heart-valve replacement: Secondary | ICD-10-CM

## 2010-11-02 DIAGNOSIS — I059 Rheumatic mitral valve disease, unspecified: Secondary | ICD-10-CM

## 2010-11-02 DIAGNOSIS — Z7901 Long term (current) use of anticoagulants: Secondary | ICD-10-CM

## 2010-11-02 LAB — POCT INR: INR: 1.9

## 2010-11-14 ENCOUNTER — Other Ambulatory Visit: Payer: Self-pay | Admitting: *Deleted

## 2010-11-14 MED ORDER — ATORVASTATIN CALCIUM 10 MG PO TABS: 10.0000 mg | ORAL_TABLET | Freq: Every day | ORAL | Status: DC

## 2010-11-14 NOTE — Telephone Encounter (Signed)
escribe medication per fax request  

## 2010-11-23 ENCOUNTER — Ambulatory Visit (INDEPENDENT_AMBULATORY_CARE_PROVIDER_SITE_OTHER): Payer: Medicare Other | Admitting: *Deleted

## 2010-11-23 DIAGNOSIS — I059 Rheumatic mitral valve disease, unspecified: Secondary | ICD-10-CM

## 2010-11-23 DIAGNOSIS — Z7901 Long term (current) use of anticoagulants: Secondary | ICD-10-CM

## 2010-11-23 DIAGNOSIS — Z954 Presence of other heart-valve replacement: Secondary | ICD-10-CM

## 2010-11-30 ENCOUNTER — Ambulatory Visit (INDEPENDENT_AMBULATORY_CARE_PROVIDER_SITE_OTHER): Payer: Medicare Other | Admitting: *Deleted

## 2010-11-30 DIAGNOSIS — Z7901 Long term (current) use of anticoagulants: Secondary | ICD-10-CM

## 2010-11-30 DIAGNOSIS — Z954 Presence of other heart-valve replacement: Secondary | ICD-10-CM

## 2010-11-30 DIAGNOSIS — I059 Rheumatic mitral valve disease, unspecified: Secondary | ICD-10-CM

## 2010-11-30 LAB — POCT INR: INR: 1.8

## 2010-12-14 ENCOUNTER — Ambulatory Visit (INDEPENDENT_AMBULATORY_CARE_PROVIDER_SITE_OTHER): Payer: Medicare Other | Admitting: *Deleted

## 2010-12-14 DIAGNOSIS — Z7901 Long term (current) use of anticoagulants: Secondary | ICD-10-CM

## 2010-12-14 DIAGNOSIS — Z954 Presence of other heart-valve replacement: Secondary | ICD-10-CM

## 2010-12-14 DIAGNOSIS — I059 Rheumatic mitral valve disease, unspecified: Secondary | ICD-10-CM

## 2010-12-14 LAB — POCT INR: INR: 3.6

## 2010-12-22 ENCOUNTER — Other Ambulatory Visit: Payer: Self-pay | Admitting: Cardiology

## 2011-01-04 ENCOUNTER — Ambulatory Visit (INDEPENDENT_AMBULATORY_CARE_PROVIDER_SITE_OTHER): Payer: Medicare Other | Admitting: *Deleted

## 2011-01-04 DIAGNOSIS — Z954 Presence of other heart-valve replacement: Secondary | ICD-10-CM

## 2011-01-04 DIAGNOSIS — I059 Rheumatic mitral valve disease, unspecified: Secondary | ICD-10-CM

## 2011-01-04 DIAGNOSIS — Z7901 Long term (current) use of anticoagulants: Secondary | ICD-10-CM

## 2011-01-18 ENCOUNTER — Ambulatory Visit (INDEPENDENT_AMBULATORY_CARE_PROVIDER_SITE_OTHER): Payer: Medicare Other | Admitting: *Deleted

## 2011-01-18 ENCOUNTER — Other Ambulatory Visit: Payer: Self-pay | Admitting: *Deleted

## 2011-01-18 DIAGNOSIS — E785 Hyperlipidemia, unspecified: Secondary | ICD-10-CM

## 2011-01-18 DIAGNOSIS — Z954 Presence of other heart-valve replacement: Secondary | ICD-10-CM

## 2011-01-18 DIAGNOSIS — R001 Bradycardia, unspecified: Secondary | ICD-10-CM

## 2011-01-18 DIAGNOSIS — Z7901 Long term (current) use of anticoagulants: Secondary | ICD-10-CM

## 2011-01-18 DIAGNOSIS — I4891 Unspecified atrial fibrillation: Secondary | ICD-10-CM

## 2011-01-18 DIAGNOSIS — I059 Rheumatic mitral valve disease, unspecified: Secondary | ICD-10-CM

## 2011-01-18 LAB — POCT INR: INR: 4

## 2011-01-18 MED ORDER — POTASSIUM CHLORIDE CRYS ER 10 MEQ PO TBCR: 10.0000 meq | EXTENDED_RELEASE_TABLET | Freq: Every day | ORAL | Status: DC

## 2011-02-01 ENCOUNTER — Ambulatory Visit (INDEPENDENT_AMBULATORY_CARE_PROVIDER_SITE_OTHER): Payer: Medicare Other | Admitting: *Deleted

## 2011-02-01 DIAGNOSIS — Z7901 Long term (current) use of anticoagulants: Secondary | ICD-10-CM

## 2011-02-01 DIAGNOSIS — I059 Rheumatic mitral valve disease, unspecified: Secondary | ICD-10-CM

## 2011-02-01 DIAGNOSIS — Z954 Presence of other heart-valve replacement: Secondary | ICD-10-CM

## 2011-02-01 LAB — POCT INR: INR: 2

## 2011-02-10 ENCOUNTER — Other Ambulatory Visit: Payer: Self-pay | Admitting: Cardiology

## 2011-02-15 ENCOUNTER — Ambulatory Visit (INDEPENDENT_AMBULATORY_CARE_PROVIDER_SITE_OTHER): Payer: Medicare Other | Admitting: *Deleted

## 2011-02-15 DIAGNOSIS — Z954 Presence of other heart-valve replacement: Secondary | ICD-10-CM

## 2011-02-15 DIAGNOSIS — Z7901 Long term (current) use of anticoagulants: Secondary | ICD-10-CM

## 2011-02-15 DIAGNOSIS — I059 Rheumatic mitral valve disease, unspecified: Secondary | ICD-10-CM

## 2011-02-15 LAB — POCT INR: INR: 1.1

## 2011-02-15 MED ORDER — WARFARIN SODIUM 5 MG PO TABS: 5.0000 mg | ORAL_TABLET | ORAL | Status: DC

## 2011-02-15 NOTE — Patient Instructions (Signed)
Patient ran out of her coumadin, she is 1.1, risks and complications reviewed with patient, she is a range of 2.5-3.5. Patient dose has her warfarin now, [facility] due to patient states that works better and she has sensitivity to warfarin.

## 2011-02-20 ENCOUNTER — Encounter: Payer: Self-pay | Admitting: Cardiology

## 2011-02-22 ENCOUNTER — Encounter: Payer: Medicare Other | Admitting: *Deleted

## 2011-02-27 ENCOUNTER — Encounter: Payer: Self-pay | Admitting: Cardiology

## 2011-02-27 ENCOUNTER — Ambulatory Visit (INDEPENDENT_AMBULATORY_CARE_PROVIDER_SITE_OTHER): Payer: Medicare Other | Admitting: Cardiology

## 2011-02-27 DIAGNOSIS — Z950 Presence of cardiac pacemaker: Secondary | ICD-10-CM

## 2011-02-27 DIAGNOSIS — I4891 Unspecified atrial fibrillation: Secondary | ICD-10-CM

## 2011-02-27 DIAGNOSIS — Z954 Presence of other heart-valve replacement: Secondary | ICD-10-CM

## 2011-02-27 NOTE — Progress Notes (Signed)
HPI  The patient was previously seen by Dr. Deborah Chalk. She sees Dr. Johney Frame for her pacemaker.  This is her first visit with me.  Since she was last seen she apparently has done relatively well. She's been switched to warfarin from Coumadin and says she's had some trouble with her INRs. She would like to be followed completely in [facility]. Currently her INR is followed in our [place] office. She denies any new cardiovascular symptoms. In particular she denies any palpitations, presyncope or syncope. She has no chest pressure, neck or arm discomfort. She denies any weight gain, new shortness of breath, PND or orthopnea.    Allergies  Allergen Reactions  . Penicillins   . Warfarin And Related     Current Outpatient Prescriptions  Medication Sig Dispense Refill  . atorvastatin (LIPITOR) 10 MG tablet Take 1 tablet (10 mg total) by mouth daily.  30 tablet  3  . digoxin (LANOXIN) 0.125 MG tablet Take 1 tablet (125 mcg total) by mouth daily.  30 tablet  6  . FUROSEMIDE PO Take by mouth.        . metoprolol (TOPROL-XL) 50 MG 24 hr tablet Take 1 tablet (50 mg total) by mouth daily.  30 tablet  5  . PARoxetine (PAXIL) 40 MG tablet TAKE 1 TABLET EVERY DAY  30 tablet  0  . potassium chloride (KLOR-CON M10) 10 MEQ tablet Take 1 tablet (10 mEq total) by mouth daily.  30 tablet  12  . warfarin (COUMADIN) 5 MG tablet Take 1 tablet (5 mg total) by mouth as directed. No generic needs brand name coumadin  30 tablet  3    Past Medical History  Diagnosis Date  . Mild aortic stenosis     AVA 1.1cm2 by echo 12/10  . Atrial fibrillation     permanent  . Depression   . Hyperlipidemia   . Rheumatic heart disease 09/1994    s/p mechanical mitral valve repair (#33 St Jude valve)  . Tinnitus of both ears   . Paget's bone disease     skull  . Hypertension   . Meningioma   . Bradycardia     s/p PPM    Past Surgical History  Procedure Date  . Pacemaker insertion   . Right ankle surgery    with pin and plate  . Abdominal hysterectomy     total  . Cholecystectomy   . Knee arthroscopy     total  . Mitral valve replacement 09/1994    #33 St Jude valve    ROS:  As stated in the HPI and negative for all other systems.  PHYSICAL EXAM BP 150/72  Pulse 118  Resp 18  Ht 5\' 6"  (1.676 m)  Wt 182 lb (82.555 kg)  BMI 29.38 kg/m2 GENERAL:  Well appearing HEENT:  Pupils equal round and reactive, fundi not visualized, oral mucosa unremarkable NECK:  No jugular venous distention, waveform within normal limits, carotid upstroke brisk and symmetric, no bruits, no thyromegaly LYMPHATICS:  No cervical, inguinal adenopathy LUNGS:  Clear to auscultation bilaterally BACK:  No CVA tenderness CHEST:  Well healed sternotomy scar, right sided pacemaker HEART:  PMI not displaced or sustained, mechanical S1 and normal S2 within normal limits, no S3, no clicks, no rubs, no murmurs, irregular  ABD:  Flat, positive bowel sounds normal in frequency in pitch, no bruits, no rebound, no guarding, no midline pulsatile mass, no hepatomegaly, no splenomegaly EXT:  2 plus pulses throughout, no edema, no  cyanosis no clubbing SKIN:  No rashes, multiple lipomas NEURO:  Cranial nerves II through XII grossly intact, motor grossly intact throughout PSYCH:  Cognitively intact, oriented to person place and time  EKG:  Atrial fibrillation, rate 108, poor anterior R wave progression, leftward axis, paced ventricular beats, QTC slightly prolonged, no acute ST-T wave changes.  ASSESSMENT AND PLAN

## 2011-02-27 NOTE — Patient Instructions (Signed)
The current medical regimen is effective;  continue present plan and medications.  See Dr Johney Frame in the [place] office in May 2013.

## 2011-02-27 NOTE — Assessment & Plan Note (Signed)
I will arrange for her to have her INR checked in our [place] office. We talked about possibly switching back to brand name Coumadin as she's had trouble with her INR is and also thinks warfarin causes muscle aches. However, she does not want be added expense and I told her that we should be would get her regulated on generic. She understands the importance of followup and we will arrange to have this done in [place]

## 2011-02-27 NOTE — Assessment & Plan Note (Signed)
She is doing well this this.  No change in therapy is indicated.

## 2011-02-27 NOTE — Assessment & Plan Note (Signed)
I will arrange for the patient to be seen in the [facility] pacemaker clinic.

## 2011-03-01 ENCOUNTER — Encounter: Payer: Medicare Other | Admitting: *Deleted

## 2011-03-08 ENCOUNTER — Encounter: Payer: Medicare Other | Admitting: *Deleted

## 2011-03-11 ENCOUNTER — Encounter: Payer: Medicare Other | Admitting: *Deleted

## 2011-03-18 ENCOUNTER — Encounter: Payer: Medicare Other | Admitting: *Deleted

## 2011-03-21 ENCOUNTER — Ambulatory Visit (INDEPENDENT_AMBULATORY_CARE_PROVIDER_SITE_OTHER): Payer: Medicare Other | Admitting: *Deleted

## 2011-03-21 DIAGNOSIS — Z7901 Long term (current) use of anticoagulants: Secondary | ICD-10-CM

## 2011-03-21 DIAGNOSIS — I059 Rheumatic mitral valve disease, unspecified: Secondary | ICD-10-CM

## 2011-03-21 DIAGNOSIS — Z954 Presence of other heart-valve replacement: Secondary | ICD-10-CM

## 2011-04-04 ENCOUNTER — Other Ambulatory Visit: Payer: Self-pay | Admitting: Cardiology

## 2011-04-12 ENCOUNTER — Ambulatory Visit (INDEPENDENT_AMBULATORY_CARE_PROVIDER_SITE_OTHER): Payer: Medicare Other | Admitting: *Deleted

## 2011-04-12 DIAGNOSIS — I059 Rheumatic mitral valve disease, unspecified: Secondary | ICD-10-CM

## 2011-04-12 DIAGNOSIS — Z954 Presence of other heart-valve replacement: Secondary | ICD-10-CM

## 2011-04-12 DIAGNOSIS — Z7901 Long term (current) use of anticoagulants: Secondary | ICD-10-CM

## 2011-04-26 ENCOUNTER — Ambulatory Visit (INDEPENDENT_AMBULATORY_CARE_PROVIDER_SITE_OTHER): Payer: Medicare Other | Admitting: *Deleted

## 2011-04-26 ENCOUNTER — Encounter: Payer: Self-pay | Admitting: *Deleted

## 2011-04-26 DIAGNOSIS — Z954 Presence of other heart-valve replacement: Secondary | ICD-10-CM

## 2011-04-26 DIAGNOSIS — I059 Rheumatic mitral valve disease, unspecified: Secondary | ICD-10-CM

## 2011-04-26 DIAGNOSIS — Z7901 Long term (current) use of anticoagulants: Secondary | ICD-10-CM

## 2011-04-26 LAB — POCT INR: INR: 2.2

## 2011-05-09 ENCOUNTER — Other Ambulatory Visit: Payer: Self-pay | Admitting: Pharmacist

## 2011-05-09 MED ORDER — WARFARIN SODIUM 5 MG PO TABS: ORAL_TABLET | ORAL | Status: DC

## 2011-05-17 ENCOUNTER — Ambulatory Visit (INDEPENDENT_AMBULATORY_CARE_PROVIDER_SITE_OTHER): Payer: Medicare Other | Admitting: *Deleted

## 2011-05-17 DIAGNOSIS — I059 Rheumatic mitral valve disease, unspecified: Secondary | ICD-10-CM

## 2011-05-17 DIAGNOSIS — Z7901 Long term (current) use of anticoagulants: Secondary | ICD-10-CM

## 2011-05-17 DIAGNOSIS — Z954 Presence of other heart-valve replacement: Secondary | ICD-10-CM

## 2011-05-17 LAB — POCT INR: INR: 2.1

## 2011-05-27 ENCOUNTER — Other Ambulatory Visit: Payer: Self-pay | Admitting: *Deleted

## 2011-05-29 ENCOUNTER — Other Ambulatory Visit: Payer: Self-pay | Admitting: *Deleted

## 2011-05-29 ENCOUNTER — Ambulatory Visit: Payer: Medicare Other | Admitting: Cardiology

## 2011-05-29 MED ORDER — PAROXETINE HCL 40 MG PO TABS: 40.0000 mg | ORAL_TABLET | Freq: Every day | ORAL | Status: DC

## 2011-05-29 NOTE — Telephone Encounter (Signed)
Refill on paroxetine x 3

## 2011-06-03 ENCOUNTER — Other Ambulatory Visit: Payer: Self-pay

## 2011-06-03 ENCOUNTER — Other Ambulatory Visit: Payer: Self-pay | Admitting: Cardiology

## 2011-06-03 MED ORDER — PAROXETINE HCL 40 MG PO TABS: 40.0000 mg | ORAL_TABLET | Freq: Every day | ORAL | Status: DC

## 2011-06-07 ENCOUNTER — Ambulatory Visit (INDEPENDENT_AMBULATORY_CARE_PROVIDER_SITE_OTHER): Payer: Medicare Other | Admitting: *Deleted

## 2011-06-07 DIAGNOSIS — Z954 Presence of other heart-valve replacement: Secondary | ICD-10-CM

## 2011-06-07 DIAGNOSIS — Z7901 Long term (current) use of anticoagulants: Secondary | ICD-10-CM

## 2011-06-07 DIAGNOSIS — I059 Rheumatic mitral valve disease, unspecified: Secondary | ICD-10-CM

## 2011-06-26 ENCOUNTER — Ambulatory Visit (INDEPENDENT_AMBULATORY_CARE_PROVIDER_SITE_OTHER): Payer: Medicare Other | Admitting: Cardiology

## 2011-06-26 ENCOUNTER — Encounter: Payer: Self-pay | Admitting: Cardiology

## 2011-06-26 VITALS — BP 105/60 | HR 67 | Ht 67.0 in | Wt 187.0 lb

## 2011-06-26 DIAGNOSIS — I4891 Unspecified atrial fibrillation: Secondary | ICD-10-CM

## 2011-06-26 DIAGNOSIS — Z950 Presence of cardiac pacemaker: Secondary | ICD-10-CM

## 2011-06-26 DIAGNOSIS — Z954 Presence of other heart-valve replacement: Secondary | ICD-10-CM

## 2011-06-26 NOTE — Assessment & Plan Note (Signed)
She had her last echo in 2010.  No further imaging is indicated at this appt.

## 2011-06-26 NOTE — Assessment & Plan Note (Signed)
I will make sure that she has up to date follow up.

## 2011-06-26 NOTE — Patient Instructions (Signed)
The current medical regimen is effective;  continue present plan and medications.  Please have blood work today. (BMP,CBC, TSH and hepatic panel)  Follow up in 6 months with Dr Antoine Poche.  You will receive a letter in the mail 2 months before you are due.  Please call us when you receive this letter to schedule your follow up appointment.

## 2011-06-26 NOTE — Assessment & Plan Note (Addendum)
The patient  tolerates this rhythm and rate control and anticoagulation. We will continue with the meds as listed. Of note I will check routine labs including a CBC. She also mentioned some renal insufficiency he said he will get a CMET.

## 2011-06-26 NOTE — Progress Notes (Signed)
HPI  The patient was previously seen by Dr. Deborah Chalk. She sees Dr. Johney Frame for her pacemaker. She returns for follow up and she has had no new cardiovascular problems.  The patient denies any new symptoms such as chest discomfort, neck or arm discomfort. There has been no new shortness of breath, PND or orthopnea. There have been no reported palpitations, presyncope or syncope.  She has had some trouble with her warfarin but this has been straightened out.    Allergies  Allergen Reactions  . Penicillins   . Warfarin And Related     Current Outpatient Prescriptions  Medication Sig Dispense Refill  . atorvastatin (LIPITOR) 10 MG tablet Take 1 tablet (10 mg total) by mouth daily.  30 tablet  3  . digoxin (LANOXIN) 0.125 MG tablet Take 1 tablet (125 mcg total) by mouth daily.  30 tablet  6  . FUROSEMIDE PO Take by mouth.        . metoprolol (TOPROL-XL) 50 MG 24 hr tablet Take 1 tablet (50 mg total) by mouth daily.  30 tablet  5  . PARoxetine (PAXIL) 40 MG tablet Take 1 tablet (40 mg total) by mouth daily.  30 tablet  2  . potassium chloride (KLOR-CON M10) 10 MEQ tablet Take 1 tablet (10 mEq total) by mouth daily.  30 tablet  12  . warfarin (COUMADIN) 5 MG tablet Take as directed by Anticoagulation clinic.  Pt takes up to 1 1/2 tablets daily.  Needs brand name coumadin  45 tablet  3    Past Medical History  Diagnosis Date  . Mild aortic stenosis     AVA 1.1cm2 by echo 12/10  . Atrial fibrillation     permanent  . Depression   . Hyperlipidemia   . Rheumatic heart disease 09/1994    s/p mechanical mitral valve repair (#33 St Jude valve)  . Tinnitus of both ears   . Paget's bone disease     skull  . Hypertension   . Meningioma   . Bradycardia     s/p PPM    Past Surgical History  Procedure Date  . Pacemaker insertion   . Right ankle surgery     with pin and plate  . Abdominal hysterectomy     total  . Cholecystectomy   . Knee arthroscopy     total  . Mitral valve  replacement 09/1994    #33 St Jude valve    ROS:  As stated in the HPI and negative for all other systems.  PHYSICAL EXAM BP 105/60  Pulse 67  Ht 5\' 7"  (1.702 m)  Wt 187 lb (84.823 kg)  BMI 29.29 kg/m2 GENERAL:  Well appearing HEENT:  Pupils equal round and reactive, fundi not visualized, oral mucosa unremarkable NECK:  No jugular venous distention, waveform within normal limits, carotid upstroke brisk and symmetric, no bruits, no thyromegaly LYMPHATICS:  No cervical, inguinal adenopathy LUNGS:  Clear to auscultation bilaterally BACK:  No CVA tenderness CHEST:  Well healed sternotomy scar, right sided pacemaker HEART:  PMI not displaced or sustained, mechanical S1 and normal S2 within normal limits, no S3, no clicks, no rubs, no murmurs, irregular  ABD:  Flat, positive bowel sounds normal in frequency in pitch, no bruits, no rebound, no guarding, no midline pulsatile mass, no hepatomegaly, no splenomegaly EXT:  2 plus pulses throughout, no edema, no cyanosis no clubbing SKIN:  No rashes, multiple lipomas NEURO:  Cranial nerves II through XII grossly intact, motor grossly intact  throughout [facility]:  Cognitively intact, oriented to person place and time  EKG:  Atrial fibrillation, rate 67 , with ventricular demand pacing. .06/26/2011    ASSESSMENT AND PLAN

## 2011-07-03 ENCOUNTER — Other Ambulatory Visit: Payer: Self-pay | Admitting: Cardiology

## 2011-07-03 DIAGNOSIS — I4891 Unspecified atrial fibrillation: Secondary | ICD-10-CM

## 2011-07-03 DIAGNOSIS — I059 Rheumatic mitral valve disease, unspecified: Secondary | ICD-10-CM

## 2011-07-03 DIAGNOSIS — E785 Hyperlipidemia, unspecified: Secondary | ICD-10-CM

## 2011-07-03 DIAGNOSIS — R001 Bradycardia, unspecified: Secondary | ICD-10-CM

## 2011-07-03 MED ORDER — POTASSIUM CHLORIDE CRYS ER 10 MEQ PO TBCR: 10.0000 meq | EXTENDED_RELEASE_TABLET | Freq: Every day | ORAL | Status: DC

## 2011-07-03 MED ORDER — METOPROLOL SUCCINATE ER 50 MG PO TB24: 50.0000 mg | ORAL_TABLET | Freq: Every day | ORAL | Status: DC

## 2011-07-04 ENCOUNTER — Other Ambulatory Visit: Payer: Self-pay | Admitting: *Deleted

## 2011-07-04 ENCOUNTER — Other Ambulatory Visit: Payer: Self-pay

## 2011-07-04 DIAGNOSIS — I4891 Unspecified atrial fibrillation: Secondary | ICD-10-CM

## 2011-07-04 MED ORDER — DIGOXIN 125 MCG PO TABS: 125.0000 ug | ORAL_TABLET | Freq: Every day | ORAL | Status: DC

## 2011-07-04 MED ORDER — ATORVASTATIN CALCIUM 10 MG PO TABS: 10.0000 mg | ORAL_TABLET | Freq: Every day | ORAL | Status: DC

## 2011-07-04 MED ORDER — PAROXETINE HCL 40 MG PO TABS: 40.0000 mg | ORAL_TABLET | Freq: Every day | ORAL | Status: DC

## 2011-07-05 ENCOUNTER — Ambulatory Visit (INDEPENDENT_AMBULATORY_CARE_PROVIDER_SITE_OTHER): Payer: Medicare Other | Admitting: *Deleted

## 2011-07-05 DIAGNOSIS — I059 Rheumatic mitral valve disease, unspecified: Secondary | ICD-10-CM

## 2011-07-05 DIAGNOSIS — Z954 Presence of other heart-valve replacement: Secondary | ICD-10-CM

## 2011-07-05 DIAGNOSIS — Z7901 Long term (current) use of anticoagulants: Secondary | ICD-10-CM

## 2011-07-05 LAB — POCT INR: INR: 2.9

## 2011-07-05 MED ORDER — WARFARIN SODIUM 5 MG PO TABS: ORAL_TABLET | ORAL | Status: DC

## 2011-08-02 ENCOUNTER — Ambulatory Visit (INDEPENDENT_AMBULATORY_CARE_PROVIDER_SITE_OTHER): Payer: Medicare Other | Admitting: *Deleted

## 2011-08-02 DIAGNOSIS — Z954 Presence of other heart-valve replacement: Secondary | ICD-10-CM

## 2011-08-02 DIAGNOSIS — I059 Rheumatic mitral valve disease, unspecified: Secondary | ICD-10-CM

## 2011-08-02 DIAGNOSIS — Z7901 Long term (current) use of anticoagulants: Secondary | ICD-10-CM

## 2011-08-13 ENCOUNTER — Ambulatory Visit (INDEPENDENT_AMBULATORY_CARE_PROVIDER_SITE_OTHER): Payer: Medicare Other | Admitting: *Deleted

## 2011-08-13 DIAGNOSIS — Z7901 Long term (current) use of anticoagulants: Secondary | ICD-10-CM

## 2011-08-13 DIAGNOSIS — Z954 Presence of other heart-valve replacement: Secondary | ICD-10-CM

## 2011-08-13 DIAGNOSIS — I059 Rheumatic mitral valve disease, unspecified: Secondary | ICD-10-CM

## 2011-08-13 LAB — POCT INR: INR: 4.2

## 2011-08-30 ENCOUNTER — Ambulatory Visit (INDEPENDENT_AMBULATORY_CARE_PROVIDER_SITE_OTHER): Payer: Medicare Other | Admitting: *Deleted

## 2011-08-30 DIAGNOSIS — I059 Rheumatic mitral valve disease, unspecified: Secondary | ICD-10-CM

## 2011-08-30 DIAGNOSIS — Z954 Presence of other heart-valve replacement: Secondary | ICD-10-CM

## 2011-08-30 DIAGNOSIS — Z7901 Long term (current) use of anticoagulants: Secondary | ICD-10-CM

## 2011-08-30 LAB — POCT INR: INR: 1.7

## 2011-09-03 ENCOUNTER — Other Ambulatory Visit: Payer: Self-pay | Admitting: Pharmacist

## 2011-09-03 ENCOUNTER — Telehealth: Payer: Self-pay | Admitting: Cardiology

## 2011-09-03 MED ORDER — COUMADIN 5 MG PO TABS: ORAL_TABLET | ORAL | Status: DC

## 2011-09-03 NOTE — Telephone Encounter (Signed)
Error

## 2011-09-13 ENCOUNTER — Ambulatory Visit (INDEPENDENT_AMBULATORY_CARE_PROVIDER_SITE_OTHER): Payer: Medicare Other | Admitting: *Deleted

## 2011-09-13 DIAGNOSIS — Z7901 Long term (current) use of anticoagulants: Secondary | ICD-10-CM

## 2011-09-13 DIAGNOSIS — I059 Rheumatic mitral valve disease, unspecified: Secondary | ICD-10-CM

## 2011-09-13 DIAGNOSIS — Z954 Presence of other heart-valve replacement: Secondary | ICD-10-CM

## 2011-09-13 LAB — POCT INR: INR: 4.4

## 2011-10-01 ENCOUNTER — Ambulatory Visit (INDEPENDENT_AMBULATORY_CARE_PROVIDER_SITE_OTHER): Payer: Medicare Other | Admitting: *Deleted

## 2011-10-01 DIAGNOSIS — I059 Rheumatic mitral valve disease, unspecified: Secondary | ICD-10-CM

## 2011-10-01 DIAGNOSIS — Z954 Presence of other heart-valve replacement: Secondary | ICD-10-CM

## 2011-10-01 DIAGNOSIS — Z7901 Long term (current) use of anticoagulants: Secondary | ICD-10-CM

## 2011-10-24 ENCOUNTER — Ambulatory Visit (INDEPENDENT_AMBULATORY_CARE_PROVIDER_SITE_OTHER): Payer: Medicare Other | Admitting: *Deleted

## 2011-10-24 ENCOUNTER — Encounter: Payer: Self-pay | Admitting: Internal Medicine

## 2011-10-24 ENCOUNTER — Encounter (INDEPENDENT_AMBULATORY_CARE_PROVIDER_SITE_OTHER): Payer: Medicare Other

## 2011-10-24 DIAGNOSIS — Z954 Presence of other heart-valve replacement: Secondary | ICD-10-CM

## 2011-10-24 DIAGNOSIS — R0989 Other specified symptoms and signs involving the circulatory and respiratory systems: Secondary | ICD-10-CM

## 2011-10-24 DIAGNOSIS — I498 Other specified cardiac arrhythmias: Secondary | ICD-10-CM

## 2011-10-24 DIAGNOSIS — R001 Bradycardia, unspecified: Secondary | ICD-10-CM

## 2011-10-24 DIAGNOSIS — I4891 Unspecified atrial fibrillation: Secondary | ICD-10-CM

## 2011-10-24 DIAGNOSIS — I059 Rheumatic mitral valve disease, unspecified: Secondary | ICD-10-CM

## 2011-10-24 LAB — PACEMAKER DEVICE OBSERVATION
BMOD-0003RV: 30
RV LEAD AMPLITUDE: 15.67 mv
RV LEAD IMPEDENCE PM: 559 Ohm
RV LEAD THRESHOLD: 0.5 V
VENTRICULAR PACING PM: 61

## 2011-10-24 LAB — POCT INR: INR: 2

## 2011-10-24 NOTE — Progress Notes (Signed)
Pacer check in clinic  

## 2011-11-06 ENCOUNTER — Ambulatory Visit (INDEPENDENT_AMBULATORY_CARE_PROVIDER_SITE_OTHER): Payer: Medicare Other | Admitting: Internal Medicine

## 2011-11-06 ENCOUNTER — Encounter: Payer: Self-pay | Admitting: *Deleted

## 2011-11-06 ENCOUNTER — Encounter (HOSPITAL_COMMUNITY): Payer: Self-pay | Admitting: Pharmacy Technician

## 2011-11-06 ENCOUNTER — Encounter: Payer: Self-pay | Admitting: Internal Medicine

## 2011-11-06 VITALS — BP 115/68 | HR 66 | Ht 67.0 in | Wt 177.0 lb

## 2011-11-06 DIAGNOSIS — R001 Bradycardia, unspecified: Secondary | ICD-10-CM

## 2011-11-06 DIAGNOSIS — I4891 Unspecified atrial fibrillation: Secondary | ICD-10-CM

## 2011-11-06 DIAGNOSIS — Z954 Presence of other heart-valve replacement: Secondary | ICD-10-CM

## 2011-11-06 DIAGNOSIS — I498 Other specified cardiac arrhythmias: Secondary | ICD-10-CM

## 2011-11-06 NOTE — Patient Instructions (Addendum)
   You have been scheduled for a generator change out this Friday 11/08/11 at [facility]. Your physician recommends that you continue on your current medications as directed. Please hold your coumadin until further notice. Please refer to the Current Medication list given to you today.

## 2011-11-07 MED ORDER — VANCOMYCIN HCL IN DEXTROSE 1-5 GM/200ML-% IV SOLN
1000.0000 mg | INTRAVENOUS | Status: AC
  Filled 2011-11-07: qty 200

## 2011-11-07 MED ORDER — SODIUM CHLORIDE 0.9 % IR SOLN
80.0000 mg | Status: AC
  Filled 2011-11-07: qty 2

## 2011-11-08 ENCOUNTER — Ambulatory Visit (HOSPITAL_COMMUNITY): Payer: Medicare Other

## 2011-11-08 ENCOUNTER — Encounter (HOSPITAL_COMMUNITY)
Admission: RE | Disposition: A | Discharge status: Home / Self Care | Payer: Self-pay | Source: Ambulatory Visit | Attending: Internal Medicine

## 2011-11-08 ENCOUNTER — Ambulatory Visit (HOSPITAL_COMMUNITY)
Admission: RE | Admit: 2011-11-08 | Discharge: 2011-11-08 | Disposition: A | Discharge status: Home / Self Care | Payer: Medicare Other | Source: Ambulatory Visit | Attending: Internal Medicine | Admitting: Internal Medicine

## 2011-11-08 DIAGNOSIS — R001 Bradycardia, unspecified: Secondary | ICD-10-CM | POA: Diagnosis present

## 2011-11-08 DIAGNOSIS — T82190A Other mechanical complication of cardiac electrode, initial encounter: Secondary | ICD-10-CM | POA: Insufficient documentation

## 2011-11-08 DIAGNOSIS — Z950 Presence of cardiac pacemaker: Secondary | ICD-10-CM | POA: Diagnosis present

## 2011-11-08 DIAGNOSIS — Z954 Presence of other heart-valve replacement: Secondary | ICD-10-CM

## 2011-11-08 DIAGNOSIS — I498 Other specified cardiac arrhythmias: Secondary | ICD-10-CM | POA: Insufficient documentation

## 2011-11-08 DIAGNOSIS — Z45018 Encounter for adjustment and management of other part of cardiac pacemaker: Secondary | ICD-10-CM | POA: Insufficient documentation

## 2011-11-08 DIAGNOSIS — Y831 Surgical operation with implant of artificial internal device as the cause of abnormal reaction of the patient, or of later complication, without mention of misadventure at the time of the procedure: Secondary | ICD-10-CM | POA: Insufficient documentation

## 2011-11-08 DIAGNOSIS — I4891 Unspecified atrial fibrillation: Secondary | ICD-10-CM | POA: Diagnosis present

## 2011-11-08 HISTORY — PX: PERMANENT PACEMAKER GENERATOR CHANGE: SHX6022

## 2011-11-08 LAB — CBC
HCT: 35.5 % — ABNORMAL LOW (ref 36.0–46.0)
Hemoglobin: 12 g/dL (ref 12.0–15.0)
RBC: 3.91 MIL/uL (ref 3.87–5.11)
RDW: 13.3 % (ref 11.5–15.5)
WBC: 4.4 10*3/uL (ref 4.0–10.5)

## 2011-11-08 LAB — SURGICAL PCR SCREEN: Staphylococcus aureus: NEGATIVE

## 2011-11-08 LAB — BASIC METABOLIC PANEL
BUN: 13 mg/dL (ref 6–23)
CO2: 26 mEq/L (ref 19–32)
Chloride: 104 mEq/L (ref 96–112)
GFR calc Af Amer: 67 mL/min — ABNORMAL LOW (ref >90)
Potassium: 4.4 mEq/L (ref 3.5–5.1)

## 2011-11-08 SURGERY — PERMANENT PACEMAKER GENERATOR CHANGE
Anesthesia: LOCAL

## 2011-11-08 MED ORDER — SODIUM CHLORIDE 0.9 % IJ SOLN: 3.0000 mL | Freq: Two times a day (BID) | INTRAMUSCULAR | Status: DC

## 2011-11-08 MED ORDER — SODIUM CHLORIDE 0.9 % IV SOLN: 250.0000 mL | INTRAVENOUS | Status: DC | PRN

## 2011-11-08 MED ORDER — SODIUM CHLORIDE 0.9 % IJ SOLN: 3.0000 mL | INTRAMUSCULAR | Status: DC | PRN

## 2011-11-08 MED ORDER — LIDOCAINE HCL (PF) 1 % IJ SOLN
INTRAMUSCULAR | Status: AC
  Filled 2011-11-08: qty 60

## 2011-11-08 MED ORDER — HYDROCODONE-ACETAMINOPHEN 5-325 MG PO TABS: 1.0000 | ORAL_TABLET | ORAL | Status: DC | PRN

## 2011-11-08 MED ORDER — ONDANSETRON HCL 4 MG/2ML IJ SOLN: 4.0000 mg | Freq: Four times a day (QID) | INTRAMUSCULAR | Status: DC | PRN

## 2011-11-08 MED ORDER — CHLORHEXIDINE GLUCONATE 4 % EX LIQD
60.0000 mL | Freq: Once | CUTANEOUS | Status: AC
  Administered 2011-11-08: 4 via TOPICAL
  Filled 2011-11-08: qty 60

## 2011-11-08 MED ORDER — MUPIROCIN 2 % EX OINT
TOPICAL_OINTMENT | CUTANEOUS | Status: AC
  Administered 2011-11-08: 1 via NASAL
  Filled 2011-11-08: qty 22

## 2011-11-08 MED ORDER — SODIUM CHLORIDE 0.45 % IV SOLN
INTRAVENOUS | Status: DC
  Administered 2011-11-08: 10:00:00 via INTRAVENOUS

## 2011-11-08 MED ORDER — MIDAZOLAM HCL 5 MG/5ML IJ SOLN
INTRAMUSCULAR | Status: AC
  Filled 2011-11-08: qty 5

## 2011-11-08 MED ORDER — FENTANYL CITRATE 0.05 MG/ML IJ SOLN
INTRAMUSCULAR | Status: AC
  Filled 2011-11-08: qty 2

## 2011-11-08 MED ORDER — ACETAMINOPHEN 325 MG PO TABS: 325.0000 mg | ORAL_TABLET | ORAL | Status: DC | PRN

## 2011-11-08 NOTE — Progress Notes (Signed)
Annette Short is a pleasant 78 y.o. yo patient with a h/o rheumatic heart disease s/p MVR (mechanical #33 St Jude valve) 1996, permanent atrial fibrillation, and bradycardia sp PPM (MDT) by Dr Tennant  who presents today to follow-up in the Electrophysiology device clinic.   The patient reports doing very well since having a pacemaker implanted and remains very active despite her age.   Today, she  denies symptoms of palpitations, chest pain, shortness of breath, orthopnea, PND, lower extremity edema, dizziness, presyncope, syncope, or neurologic sequela.  She has reached ERI battery status.  She therefore presents for consideration of device replacement. The patientis tolerating medications without difficulties and is otherwise without complaint today.   Past Medical History  Diagnosis Date  . Mild aortic stenosis     AVA 1.1cm2 by echo 12/10  . Atrial fibrillation     permanent  . Depression   . Hyperlipidemia   . Rheumatic heart disease 09/1994    s/p mechanical mitral valve repair (#33 St Jude valve)  . Tinnitus of both ears   . Paget's bone disease     skull  . Hypertension   . Meningioma   . Bradycardia     s/p PPM    Past Surgical History  Procedure Date  . Pacemaker insertion   . Right ankle surgery     with pin and plate  . Abdominal hysterectomy     total  . Cholecystectomy   . Knee arthroscopy     total  . Mitral valve replacement 09/1994    #33 St Jude valve    History   Social History  . Marital Status: Widowed    Spouse Name: N/A    Number of Children: N/A  . Years of Education: N/A   Occupational History  . Not on file.   Social History Main Topics  . Smoking status: Former Smoker    Quit date: 06/25/1993  . Smokeless tobacco: Not on file  . Alcohol Use: No  . Drug Use: No  . Sexually Active: Not on file   Other Topics Concern  . Not on file   Social History Narrative   widow    No family history on file.  Allergies  Allergen Reactions    . Penicillins   . Warfarin And Related     Higher dosage in the past    Current Outpatient Prescriptions  Medication Sig Dispense Refill  . atorvastatin (LIPITOR) 10 MG tablet Take 10 mg by mouth daily.      . digoxin (LANOXIN) 0.125 MG tablet Take 0.125 mg by mouth daily.      . metoprolol succinate (TOPROL-XL) 50 MG 24 hr tablet Take 50 mg by mouth daily. Take with or immediately following a meal.      . PARoxetine (PAXIL) 20 MG tablet Take 40 mg by mouth every morning.      . potassium chloride (K-DUR,KLOR-CON) 10 MEQ tablet Take 10 mEq by mouth daily.      . warfarin (COUMADIN) 2.5 MG tablet Take 2.5-5 mg by mouth daily. Take 5 MG every day of the week except Fridays; On Fridays, take 2.5 MG       No current facility-administered medications for this visit.   Facility-Administered Medications Ordered in Other Visits  Medication Dose Route Frequency Provider Last Rate Last Dose  . gentamicin (GARAMYCIN) 80 mg in sodium chloride irrigation 0.9 % 500 mL irrigation  80 mg Irrigation On Call Kendra P Hiatt, PHARMD      .   vancomycin (VANCOCIN) IVPB 1000 mg/200 mL premix  1,000 mg Intravenous On Call Marsden Zaino, MD        ROS- all systems are reviewed and negative except as per HPI  Physical Exam: Filed Vitals:   11/06/11 1133  BP: 115/68  Pulse: 66  Height: 5' 7" (1.702 m)  Weight: 177 lb (80.287 kg)  SpO2: 98%    GEN- The patient is elderly appearing, alert and oriented x 3 today.   Head- normocephalic, atraumatic Eyes-  Sclera clear, conjunctiva pink Ears- hearing intact Oropharynx- clear Neck- supple, no JVP Lymph- no cervical lymphadenopathy Lungs- Clear to ausculation bilaterally, normal work of breathing Chest- pacemaker pocket is well healed Heart- Regular rate and rhythm (paced), mechanical S1, no murmurs, rubs or gallops, PMI not laterally displaced GI- soft, NT, ND, + BS Extremities- no clubbing, cyanosis, or edema MS- no significant deformity or  atrophy Skin- no rash or lesion Psych- euthymic mood, full affect Neuro- strength and sensation are intact  Pacemaker interrogation- reviewed in detail today,  See PACEART report  Assessment and Plan:  

## 2011-11-08 NOTE — Assessment & Plan Note (Signed)
Pacemaker interrogation reveals that she reached ERI battery status 6/13.  She is not device dependant but does benefit from pacing. Risks, benefits, and alternatives to PPM pulse generator replacement were discussed at length today with the patient who wishes to proceed.  We will therefore plan pacemaker pulse generator replacement at the next available time.

## 2011-11-08 NOTE — Assessment & Plan Note (Signed)
Stable permanent afib

## 2011-11-08 NOTE — H&P (View-Only) (Signed)
Annette Short is a pleasant 78 y.o. yo patient with a h/o rheumatic heart disease s/p MVR (mechanical #33 St Jude valve) 1996, permanent atrial fibrillation, and bradycardia sp PPM (MDT) by Dr Deborah Chalk  who presents today to follow-up in the Electrophysiology device clinic.   The patient reports doing very well since having a pacemaker implanted and remains very active despite her age.   Today, she  denies symptoms of palpitations, chest pain, shortness of breath, orthopnea, PND, lower extremity edema, dizziness, presyncope, syncope, or neurologic sequela.  She has reached ERI battery status.  She therefore presents for consideration of device replacement. The patientis tolerating medications without difficulties and is otherwise without complaint today.   Past Medical History  Diagnosis Date  . Mild aortic stenosis     AVA 1.1cm2 by echo 12/10  . Atrial fibrillation     permanent  . Depression   . Hyperlipidemia   . Rheumatic heart disease 09/1994    s/p mechanical mitral valve repair (#33 St Jude valve)  . Tinnitus of both ears   . Paget's bone disease     skull  . Hypertension   . Meningioma   . Bradycardia     s/p PPM    Past Surgical History  Procedure Date  . Pacemaker insertion   . Right ankle surgery     with pin and plate  . Abdominal hysterectomy     total  . Cholecystectomy   . Knee arthroscopy     total  . Mitral valve replacement 09/1994    #33 St Jude valve    History   Social History  . Marital Status: Widowed    Spouse Name: N/A    Number of Children: N/A  . Years of Education: N/A   Occupational History  . Not on file.   Social History Main Topics  . Smoking status: Former Smoker    Quit date: 06/25/1993  . Smokeless tobacco: Not on file  . Alcohol Use: No  . Drug Use: No  . Sexually Active: Not on file   Other Topics Concern  . Not on file   Social History Narrative   widow    No family history on file.  Allergies  Allergen Reactions    . Penicillins   . Warfarin And Related     Higher dosage in the past    Current Outpatient Prescriptions  Medication Sig Dispense Refill  . atorvastatin (LIPITOR) 10 MG tablet Take 10 mg by mouth daily.      . digoxin (LANOXIN) 0.125 MG tablet Take 0.125 mg by mouth daily.      . metoprolol succinate (TOPROL-XL) 50 MG 24 hr tablet Take 50 mg by mouth daily. Take with or immediately following a meal.      . PARoxetine (PAXIL) 20 MG tablet Take 40 mg by mouth every morning.      . potassium chloride (K-DUR,KLOR-CON) 10 MEQ tablet Take 10 mEq by mouth daily.      Marland Kitchen warfarin (COUMADIN) 2.5 MG tablet Take 2.5-5 mg by mouth daily. Take 5 MG every day of the week except Fridays; On Fridays, take 2.5 MG       No current facility-administered medications for this visit.   Facility-Administered Medications Ordered in Other Visits  Medication Dose Route Frequency Provider Last Rate Last Dose  . gentamicin (GARAMYCIN) 80 mg in sodium chloride irrigation 0.9 % 500 mL irrigation  80 mg Irrigation On Call [employer], PHARMD      .  vancomycin (VANCOCIN) IVPB 1000 mg/200 mL premix  1,000 mg Intravenous On Call Hillis Range, MD        ROS- all systems are reviewed and negative except as per HPI  Physical Exam: Filed Vitals:   11/06/11 1133  BP: 115/68  Pulse: 66  Height: 5\' 7"  (1.702 m)  Weight: 177 lb (80.287 kg)  SpO2: 98%    GEN- The patient is elderly appearing, alert and oriented x 3 today.   Head- normocephalic, atraumatic Eyes-  Sclera clear, conjunctiva pink Ears- hearing intact Oropharynx- clear Neck- supple, no JVP Lymph- no cervical lymphadenopathy Lungs- Clear to ausculation bilaterally, normal work of breathing Chest- pacemaker pocket is well healed Heart- Regular rate and rhythm (paced), mechanical S1, no murmurs, rubs or gallops, PMI not laterally displaced GI- soft, NT, ND, + BS Extremities- no clubbing, cyanosis, or edema MS- no significant deformity or  atrophy Skin- no rash or lesion Psych- euthymic mood, full affect Neuro- strength and sensation are intact  Pacemaker interrogation- reviewed in detail today,  See PACEART report  Assessment and Plan:

## 2011-11-08 NOTE — Assessment & Plan Note (Signed)
S/p valve replacement Goal INR 2.5-3.5.  Today, INR is supratherapeutic (3.9).  She will hold coumadin x 2 days prior to the procedure.

## 2011-11-08 NOTE — Interval H&P Note (Signed)
History and Physical Interval Note:  11/08/2011 11:31 AM  Annette Short  has presented today for surgery, with the diagnosis of End of life  The various methods of treatment have been discussed with the patient and family. After consideration of risks, benefits and other options for treatment, the patient has consented to  Procedure(s) (LRB): PERMANENT PACEMAKER GENERATOR CHANGE (N/A) as a surgical intervention .  The patient's history has been reviewed, patient examined, no change in status, stable for surgery.  I have reviewed the patient's chart and labs.  Questions were answered to the patient's satisfaction.     Hillis Range

## 2011-11-08 NOTE — Op Note (Signed)
SURGEON:  Hillis Range, MD     PREPROCEDURE DIAGNOSES:   1. Symptomatic bradycardia  2. Permanent Atrial fibrillation.     POSTPROCEDURE DIAGNOSES:   1. Symptomatic bradycardia  2. Permanent Atrial fibrillation.     PROCEDURES:   1. Pacemaker pulse generator replacement.   2. Skin pocket revision.  3. Single pacemaker lead repair     INTRODUCTION:  Annette Short is a 78 y.o. female with a history of symptomatic bradycardia and permanent atrial fibrillation.  She has done well since her pacemaker was implanted.  She has recently reached ERI battery status.  She presents today for pacemaker pulse generator replacement.       DESCRIPTION OF THE PROCEDURE:  Informed written consent was obtained, and the patient was brought to the electrophysiology lab in the fasting state.  The patient's pacemaker was interrogated today and found to be at elective replacement indicator battery status.  The patient was adequately sedated with intravenous Versed as outlined in the nursing report.  The patient's right chest was prepped and draped in the usual sterile fashion by the EP lab staff.  The skin overlying the existing pacemaker was infiltrated with lidocaine for local analgesia.  A 4-cm incision was made over the pacemaker pocket.  Using a combination of sharp and blunt dissection, the pacemaker was exposed and removed from the body.  The device was disconnected from the leads. A single silk suture was identified and removed which had secured the device to the pectoralis fascia.  There was no foreign matter or debris within the pocket.  The atrial lead was confirmed to be a Guidant model [id number] (serial number [id number]) lead implanted on 05/28/2002.  Due to permanent atrial fibrillation, this lead was capped today and returned to the pocket.  The right ventricular lead was confirmed to be a Guidant model 4470 (serial number [id number]) lead implanted on the same date as the atrial lead (above).  The ventricular lead was  examined.  There was heme within the insulation but no obvious insulation breach. Right ventricular lead R-waves measured 22 mV with impedance of 475 ohms and a threshold of 0.3 V at 0.5 msec.  Silicone was applied along there visible portion of the insulation of this lead up to the collar. The ventricular lead was then connected to a Medtronic Jewett model [id number] (serial number [id number]) pacemaker.  The pocket was revised to accommodate this new device.  Electrocautery was required to assure hemostasis.  The pocket was irrigated with copious gentamicin solution. The pacemaker was then placed into the pocket.  The pocket was then closed in 2 layers with 2-0 Vicryl suture over the subcutaneous and subcuticular layers.  Steri-Strips and a sterile dressing were then applied.  There were no early apparent complications.     CONCLUSIONS:   1. Successful pacemaker pulse generator replacement for elective replacement indicator battery status   2. No early apparent complications.     Hillis Range, MD 11/08/2011 12:42 PM

## 2011-11-08 NOTE — Progress Notes (Signed)
Per Amy in cath lab MD ok with BMP pending and INR 2.77, bring pt to holding

## 2011-11-17 ENCOUNTER — Other Ambulatory Visit: Payer: Self-pay | Admitting: Cardiology

## 2011-11-22 ENCOUNTER — Encounter: Payer: Self-pay | Admitting: Internal Medicine

## 2011-11-22 ENCOUNTER — Ambulatory Visit (INDEPENDENT_AMBULATORY_CARE_PROVIDER_SITE_OTHER): Payer: Medicare Other | Admitting: Internal Medicine

## 2011-11-22 VITALS — BP 118/68 | HR 80 | Ht 67.0 in | Wt 175.1 lb

## 2011-11-22 DIAGNOSIS — R001 Bradycardia, unspecified: Secondary | ICD-10-CM

## 2011-11-22 DIAGNOSIS — I498 Other specified cardiac arrhythmias: Secondary | ICD-10-CM

## 2011-11-22 DIAGNOSIS — I4891 Unspecified atrial fibrillation: Secondary | ICD-10-CM

## 2011-11-22 LAB — PACEMAKER DEVICE OBSERVATION
BATTERY VOLTAGE: 2.79 V
BMOD-0005RV: 95 {beats}/min
VENTRICULAR PACING PM: 76.6

## 2011-11-22 NOTE — Assessment & Plan Note (Signed)
Doing well s/p recent generator change Incision is healing nicely  Normal pacemaker function See Pace Art report No changes today

## 2011-11-22 NOTE — Patient Instructions (Addendum)
Your physician recommends that you schedule a follow-up appointment in: 3 months with Dr. Allred.  Your physician recommends that you continue on your current medications as directed. Please refer to the Current Medication list given to you today.    

## 2011-11-22 NOTE — Progress Notes (Signed)
PCP: Rudi Heap, MD  Annette Short is a 78 y.o. female who presents today for routine electrophysiology followup. She is doing well since her recent PPM generator change.    Today, she denies symptoms of palpitations, chest pain, shortness of breath,  lower extremity edema, dizziness, presyncope, fevers/chills, or syncope.  The patient is otherwise without complaint today.   Past Medical History  Diagnosis Date  . Mild aortic stenosis     AVA 1.1cm2 by echo 12/10  . Atrial fibrillation     permanent  . Depression   . Hyperlipidemia   . Rheumatic heart disease 09/1994    s/p mechanical mitral valve repair (#33 St Jude valve)  . Tinnitus of both ears   . Paget's bone disease     skull  . Hypertension   . Meningioma   . Bradycardia     s/p PPM   Past Surgical History  Procedure Date  . Pacemaker insertion     generator change 8/13 by Dr Johney Frame (MDT Jana Half SR)  . Right ankle surgery     with pin and plate  . Abdominal hysterectomy     total  . Cholecystectomy   . Knee arthroscopy     total  . Mitral valve replacement 09/1994    #33 St Jude valve    Current Outpatient Prescriptions  Medication Sig Dispense Refill  . atorvastatin (LIPITOR) 10 MG tablet Take 10 mg by mouth daily.      . digoxin (LANOXIN) 0.125 MG tablet Take 0.125 mg by mouth daily.      . metoprolol succinate (TOPROL-XL) 50 MG 24 hr tablet Take 50 mg by mouth daily. Take with or immediately following a meal.      . PARoxetine (PAXIL) 20 MG tablet Take 40 mg by mouth every morning.      . potassium chloride (K-DUR,KLOR-CON) 10 MEQ tablet Take 10 mEq by mouth daily.      Marland Kitchen warfarin (COUMADIN) 2.5 MG tablet Take 2.5-5 mg by mouth daily. Take 5 MG every day of the week except Fridays; On Fridays, take 2.5 MG        Physical Exam: Filed Vitals:   11/22/11 1120  BP: 118/68  Pulse: 80  Height: 5\' 7"  (1.702 m)  Weight: 175 lb 1.9 oz (79.434 kg)    GEN- The patient is well appearing, alert and oriented x 3  today.   Head- normocephalic, atraumatic Eyes-  Sclera clear, conjunctiva pink Ears- hearing intact Oropharynx- clear Lungs- Clear to ausculation bilaterally, normal work of breathing Chest- pacemaker pocket is well healed Heart- Regular rate and rhythm (paced) GI- soft, NT, ND, + BS Extremities- no clubbing, cyanosis, or edema  Pacemaker interrogation- reviewed in detail today,  See PACEART report  Assessment and Plan:

## 2011-11-24 ENCOUNTER — Other Ambulatory Visit: Payer: Self-pay | Admitting: Cardiology

## 2011-11-25 ENCOUNTER — Other Ambulatory Visit: Payer: Self-pay | Admitting: Cardiology

## 2011-12-22 ENCOUNTER — Other Ambulatory Visit: Payer: Self-pay | Admitting: Cardiology

## 2011-12-23 NOTE — Telephone Encounter (Signed)
..   Requested Prescriptions   Pending Prescriptions Disp Refills  . PARoxetine (PAXIL) 40 MG tablet [Pharmacy Med Name: PAROXETINE HCL 40 MG TABLET] 30 tablet 2    Sig: TAKE 1 TABLET (40 MG TOTAL) BY MOUTH DAILY.

## 2012-01-17 ENCOUNTER — Other Ambulatory Visit: Payer: Self-pay | Admitting: Cardiology

## 2012-02-27 ENCOUNTER — Encounter: Payer: Self-pay | Admitting: *Deleted

## 2012-03-04 ENCOUNTER — Encounter: Payer: Self-pay | Admitting: Internal Medicine

## 2012-03-04 ENCOUNTER — Ambulatory Visit (INDEPENDENT_AMBULATORY_CARE_PROVIDER_SITE_OTHER): Payer: Medicare Other | Admitting: Internal Medicine

## 2012-03-04 VITALS — BP 123/75 | HR 77 | Ht 68.0 in | Wt 184.0 lb

## 2012-03-04 DIAGNOSIS — I4891 Unspecified atrial fibrillation: Secondary | ICD-10-CM

## 2012-03-04 DIAGNOSIS — Z954 Presence of other heart-valve replacement: Secondary | ICD-10-CM

## 2012-03-04 DIAGNOSIS — I498 Other specified cardiac arrhythmias: Secondary | ICD-10-CM

## 2012-03-04 DIAGNOSIS — R001 Bradycardia, unspecified: Secondary | ICD-10-CM

## 2012-03-04 DIAGNOSIS — I059 Rheumatic mitral valve disease, unspecified: Secondary | ICD-10-CM

## 2012-03-04 DIAGNOSIS — Z950 Presence of cardiac pacemaker: Secondary | ICD-10-CM

## 2012-03-04 LAB — PACEMAKER DEVICE OBSERVATION
BATTERY VOLTAGE: 2.79 v
BMOD-0003RV: 30
BMOD-0005RV: 95 {beats}/min
BRDY-0002RV: 60 {beats}/min
RV LEAD AMPLITUDE: 31.36 mv
RV LEAD IMPEDENCE PM: 472 Ohm
RV LEAD THRESHOLD: 0.5 v
VENTRICULAR PACING PM: 68

## 2012-03-04 NOTE — Progress Notes (Signed)
ELECTROPHYSIOLOGY OFFICE NOTE  Patient ID: Annette Short MRN: [id number], DOB/AGE: 76/24/1935   Date of Visit: 03/04/2012  Primary Physician: Rudi Heap, MD Primary Cardiologist: Antoine Poche, MD Reason for Visit: EP/device follow-up  History of Present Illness  Annette Short presents today for routine electrophysiology followup. Since last being seen in our clinic, she reports receiving the flu vaccine one month ago which she states always makes her feel terrible. She reports brief, nonexertional chest dullness a couple of days after receiving the vaccine. This occurred while she was at rest and only lasted a few minutes. She denies any recurrence since that time. She has no history of CAD. She had a cardiac cath in 1996 prior to valve surgery which showed normal coronary arteries. She is now feeling like her usual self. She has no other complaints or concerns. She denies symptoms of palpitations, shortness of breath, orthopnea, PND, lower extremity edema, dizziness, presyncope or syncope. She feels that she is tolerating medications without difficulty and is otherwise without complaint today.   Past Medical History Past Medical History  Diagnosis Date  . Mild aortic stenosis     AVA 1.1cm2 by echo 12/10  . Atrial fibrillation     permanent  . Depression   . Hyperlipidemia   . Rheumatic heart disease 09/1994    s/p mechanical mitral valve repair (#33 St Jude valve)  . Tinnitus of both ears   . Paget's bone disease     skull  . Hypertension   . Meningioma   . Bradycardia     s/p PPM    Past Surgical History Past Surgical History  Procedure Date  . Pacemaker insertion     generator change 8/13 by Dr Johney Frame (MDT Jana Half SR)  . Right ankle surgery     with pin and plate  . Abdominal hysterectomy     total  . Cholecystectomy   . Knee arthroscopy     total  . Mitral valve replacement 09/1994    #33 St Jude valve     Allergies/Intolerances Allergies  Allergen Reactions  .  Penicillins     Current Home Medications Current Outpatient Prescriptions  Medication Sig Dispense Refill  . atorvastatin (LIPITOR) 10 MG tablet Take 10 mg by mouth daily.      Marland Kitchen COUMADIN 5 MG tablet TAKE AS DIRECTED BY CLINIC-UP TO 1 AND 1/2 TABLETS DAILY  14 tablet  0  . digoxin (LANOXIN) 0.125 MG tablet Take 0.125 mg by mouth daily.      . metoprolol succinate (TOPROL-XL) 50 MG 24 hr tablet Take 50 mg by mouth daily. Take with or immediately following a meal.      . PARoxetine (PAXIL) 40 MG tablet TAKE 1 TABLET (40 MG TOTAL) BY MOUTH DAILY.  30 tablet  2  . potassium chloride (K-DUR,KLOR-CON) 10 MEQ tablet Take 10 mEq by mouth daily.        Social History Social History  . Marital Status: Widowed   Social History Main Topics  . Smoking status: Former Smoker    Quit date: 06/25/1993  . Smokeless tobacco: Not on file  . Alcohol Use: No  . Drug Use: No  . Sexually Active: Not on file   Social History Narrative   widow    Review of Systems General: No chills, fever, night sweats or weight changes Cardiovascular: No chest pain, dyspnea on exertion, edema, orthopnea, palpitations, paroxysmal nocturnal dyspnea Dermatological: No rash, lesions or masses Respiratory: No cough, dyspnea Urologic: No hematuria,  dysuria Abdominal: No nausea, vomiting, diarrhea, bright red blood per rectum, melena, or hematemesis Neurologic: No visual changes, weakness, changes in mental status All other systems reviewed and are otherwise negative except as noted above.  Physical Exam Blood pressure 123/75, pulse 77, height 5\' 8"  (1.727 m), weight 184 lb (83.462 kg), SpO2 96.00%.  General: Well developed, well appearing 78 year old female in no acute distress. HEENT: Normocephalic, atraumatic. EOMs intact. Sclera nonicteric. Oropharynx clear.  Neck: Supple. No JVD. Lungs: Respirations regular and unlabored, CTA bilaterally. No wheezes, rales or rhonchi. Heart: Regular S1, S2 present with mechanical  valve sounds noted. No murmurs, rub, S3 or S4. Abdomen: Soft, non-distended. Extremities: No clubbing, cyanosis or edema. DP/PT/Radials 2+ and equal bilaterally. Psych: Normal affect. Neuro: Alert and oriented X 3. Moves all extremities spontaneously.   Diagnostics Device interrogation performed today and shows normal VVI pacemaker function with good battery status and stable lead measurements; no episodes; no programming changes made; see PaceArt report  Assessment and Plan 1. Tachy-brady syndrome s/p PPM - device function normal; see PaceArt report; return to device clinic in 9 months; she will see me in one year 2. Chronic atrial fibrillation - stable; rate controlled as her histograms are appropriate; continue current regimen; continue Coumadin (will check INR today and ensure she has routine Coumadin follow-up since she has not done so recently) 3. Rheumatic valvular disease s/p MVR - as above, will check Coumadin/INR today; she will see Dr. Antoine Poche in 2 months

## 2012-03-04 NOTE — Patient Instructions (Signed)
Continue all current medications. Allred - August 2014 Hochrein - 2 months

## 2012-03-05 ENCOUNTER — Ambulatory Visit (INDEPENDENT_AMBULATORY_CARE_PROVIDER_SITE_OTHER): Payer: Medicare Other | Admitting: *Deleted

## 2012-03-05 DIAGNOSIS — Z7901 Long term (current) use of anticoagulants: Secondary | ICD-10-CM

## 2012-03-05 DIAGNOSIS — Z954 Presence of other heart-valve replacement: Secondary | ICD-10-CM

## 2012-03-05 DIAGNOSIS — I059 Rheumatic mitral valve disease, unspecified: Secondary | ICD-10-CM

## 2012-03-05 LAB — POCT INR: INR: 4.5

## 2012-03-24 ENCOUNTER — Ambulatory Visit (INDEPENDENT_AMBULATORY_CARE_PROVIDER_SITE_OTHER): Payer: Medicare Other | Admitting: *Deleted

## 2012-03-24 DIAGNOSIS — I059 Rheumatic mitral valve disease, unspecified: Secondary | ICD-10-CM

## 2012-03-24 DIAGNOSIS — Z7901 Long term (current) use of anticoagulants: Secondary | ICD-10-CM

## 2012-03-24 DIAGNOSIS — Z954 Presence of other heart-valve replacement: Secondary | ICD-10-CM

## 2012-03-24 LAB — POCT INR: INR: 3.7

## 2012-03-29 ENCOUNTER — Other Ambulatory Visit: Payer: Self-pay | Admitting: Cardiology

## 2012-04-14 ENCOUNTER — Ambulatory Visit (INDEPENDENT_AMBULATORY_CARE_PROVIDER_SITE_OTHER): Payer: Medicare Other | Admitting: *Deleted

## 2012-04-14 DIAGNOSIS — Z7901 Long term (current) use of anticoagulants: Secondary | ICD-10-CM

## 2012-04-14 DIAGNOSIS — Z954 Presence of other heart-valve replacement: Secondary | ICD-10-CM

## 2012-04-14 DIAGNOSIS — I059 Rheumatic mitral valve disease, unspecified: Secondary | ICD-10-CM

## 2012-05-15 ENCOUNTER — Ambulatory Visit (INDEPENDENT_AMBULATORY_CARE_PROVIDER_SITE_OTHER): Payer: Medicare Other | Admitting: *Deleted

## 2012-05-15 ENCOUNTER — Encounter: Payer: Self-pay | Admitting: Cardiology

## 2012-05-15 ENCOUNTER — Ambulatory Visit (INDEPENDENT_AMBULATORY_CARE_PROVIDER_SITE_OTHER): Payer: Medicare Other | Admitting: Cardiology

## 2012-05-15 VITALS — BP 117/66 | HR 78 | Ht 68.0 in | Wt 184.0 lb

## 2012-05-15 DIAGNOSIS — Z954 Presence of other heart-valve replacement: Secondary | ICD-10-CM

## 2012-05-15 DIAGNOSIS — Z7901 Long term (current) use of anticoagulants: Secondary | ICD-10-CM

## 2012-05-15 DIAGNOSIS — Z01810 Encounter for preprocedural cardiovascular examination: Secondary | ICD-10-CM

## 2012-05-15 DIAGNOSIS — I059 Rheumatic mitral valve disease, unspecified: Secondary | ICD-10-CM

## 2012-05-15 NOTE — Patient Instructions (Addendum)
Your physician recommends that you schedule a follow-up appointment in: 1 year. You will receive a reminder letter in the mail in about 10 months reminding you to call and schedule your appointment. If you don't receive this letter, please contact our office. Your physician recommends that you continue on your current medications as directed. Please refer to the Current Medication list given to you today. We will contact you if you need to have an echocardiogram. Your physician has requested that you have an echocardiogram. Echocardiography is a painless test that uses sound waves to create images of your heart. It provides your doctor with information about the size and shape of your heart and how well your heart's chambers and valves are working. This procedure takes approximately one hour. There are no restrictions for this procedure.

## 2012-05-15 NOTE — Progress Notes (Addendum)
HPI The patient was previously seen by Dr. Deborah Chalk. She sees Dr. Johney Frame for her pacemaker. She returns for follow up and she has had no new cardiovascular problems.  The patient denies any new symptoms such as chest discomfort, neck or arm discomfort. There has been no new shortness of breath, PND or orthopnea. There have been no reported presyncope or syncope.  She does have some short lived palpitations.  These are stable.  She has at times gets some arm pain but this has been a stable pattern. She has taken occasional NTG for years.      Allergies  Allergen Reactions  . Penicillins     Current Outpatient Prescriptions  Medication Sig Dispense Refill  . atorvastatin (LIPITOR) 10 MG tablet Take 10 mg by mouth daily.      Marland Kitchen COUMADIN 5 MG tablet TAKE AS DIRECTED BY CLINIC-UP TO 1 AND 1/2 TABLETS DAILY  14 tablet  0  . digoxin (LANOXIN) 0.125 MG tablet Take 0.125 mg by mouth daily.      Marland Kitchen latanoprost (XALATAN) 0.005 % ophthalmic solution Place 1 drop into both eyes daily.       . metoprolol succinate (TOPROL-XL) 50 MG 24 hr tablet Take 50 mg by mouth daily. Take with or immediately following a meal.      . PARoxetine (PAXIL) 40 MG tablet TAKE 1 TABLET (40 MG TOTAL) BY MOUTH DAILY.  30 tablet  2  . potassium chloride (K-DUR,KLOR-CON) 10 MEQ tablet Take 10 mEq by mouth daily.       No current facility-administered medications for this visit.    Past Medical History  Diagnosis Date  . Mild aortic stenosis     AVA 1.1cm2 by echo 12/10  . Atrial fibrillation     permanent  . Depression   . Hyperlipidemia   . Rheumatic heart disease 09/1994    s/p mechanical mitral valve repair (#33 St Jude valve)  . Tinnitus of both ears   . Paget's bone disease     skull  . Hypertension   . Meningioma   . Bradycardia     s/p PPM    Past Surgical History  Procedure Laterality Date  . Pacemaker insertion      generator change 8/13 by Dr Johney Frame (MDT Jana Half [facility])  . Right ankle surgery      with  pin and plate  . Abdominal hysterectomy      total  . Cholecystectomy    . Knee arthroscopy      total  . Mitral valve replacement  09/1994    #33 St Jude valve    ROS:  As stated in the HPI and negative for all other systems.  PHYSICAL EXAM BP 117/66  Pulse 78  Ht 5\' 8"  (1.727 m)  Wt 184 lb (83.462 kg)  BMI 27.98 kg/m2 GENERAL:  Well appearing HEENT:  Pupils equal round and reactive, fundi not visualized, oral mucosa unremarkable, dentures NECK:  No jugular venous distention, waveform within normal limits, carotid upstroke brisk and symmetric, no bruits, no thyromegaly LYMPHATICS:  No cervical, inguinal adenopathy LUNGS:  Clear to auscultation bilaterally BACK:  No CVA tenderness CHEST:  Well healed sternotomy scar, right sided pacemaker HEART:  PMI not displaced or sustained, mechanical S1 and normal S2 within normal limits, no S3, no clicks, no rubs, no murmurs, irregular  ABD:  Flat, positive bowel sounds normal in frequency in pitch, no bruits, no rebound, no guarding, no midline pulsatile mass, no hepatomegaly, no  splenomegaly EXT:  2 plus pulses throughout, no edema, no cyanosis no clubbing SKIN:  No rashes, multiple lipomas NEURO:  Cranial nerves II through XII grossly intact, motor grossly intact throughout PSYCH:  Cognitively intact, oriented to person place and time    ASSESSMENT AND PLAN  Mitral valve replacement Her last echo was in 2012.  MVR was stable. However, the EF was mildly reduced.  I would like to repeat an echocardiogram.   Atrial fibrillation The patient  tolerates this rhythm and rate control and anticoagulation. We will continue with the meds as listed.  Bradycardia She is up to date with pacemaker follow up.

## 2012-05-23 ENCOUNTER — Other Ambulatory Visit: Payer: Self-pay | Admitting: Cardiology

## 2012-05-26 NOTE — Telephone Encounter (Signed)
Annette Short does Dr Antoine Poche fill paxil thanks in advance for your help Annette Short

## 2012-05-30 ENCOUNTER — Other Ambulatory Visit: Payer: Self-pay | Admitting: Cardiology

## 2012-06-05 ENCOUNTER — Telehealth: Payer: Self-pay | Admitting: Cardiology

## 2012-06-05 MED ORDER — PAROXETINE HCL 40 MG PO TABS: ORAL_TABLET | ORAL | Status: DC

## 2012-06-05 NOTE — Telephone Encounter (Signed)
New problem   Pt doesn't have anymore of her medication Paxal she stated the pharmacy has been calling to get another prescription.

## 2012-06-10 ENCOUNTER — Other Ambulatory Visit: Payer: Medicare Other

## 2012-07-01 ENCOUNTER — Other Ambulatory Visit (INDEPENDENT_AMBULATORY_CARE_PROVIDER_SITE_OTHER): Payer: Medicare Other

## 2012-07-01 ENCOUNTER — Ambulatory Visit (INDEPENDENT_AMBULATORY_CARE_PROVIDER_SITE_OTHER): Payer: Medicare Other | Admitting: *Deleted

## 2012-07-01 DIAGNOSIS — I25810 Atherosclerosis of coronary artery bypass graft(s) without angina pectoris: Secondary | ICD-10-CM

## 2012-07-01 DIAGNOSIS — Z01810 Encounter for preprocedural cardiovascular examination: Secondary | ICD-10-CM

## 2012-07-01 DIAGNOSIS — Z954 Presence of other heart-valve replacement: Secondary | ICD-10-CM

## 2012-07-01 DIAGNOSIS — I059 Rheumatic mitral valve disease, unspecified: Secondary | ICD-10-CM

## 2012-07-01 DIAGNOSIS — Z7901 Long term (current) use of anticoagulants: Secondary | ICD-10-CM

## 2012-07-02 ENCOUNTER — Telehealth: Payer: Self-pay | Admitting: *Deleted

## 2012-07-02 NOTE — Telephone Encounter (Signed)
Called pt and went over coumadin scheduled with her.  She lost instructions from yesterday.

## 2012-07-02 NOTE — Telephone Encounter (Signed)
Would like to discuss medication with you

## 2012-07-07 ENCOUNTER — Telehealth: Payer: Self-pay | Admitting: *Deleted

## 2012-07-07 NOTE — Telephone Encounter (Signed)
Message copied by Eustace Moore on Tue Jul 07, 2012  1:34 PM ------      Message from: Meredeth Ide, PAMELA J      Created: Tue Jul 07, 2012 12:28 PM                   ----- Message -----         From: Rollene Rotunda, MD         Sent: 07/05/2012   9:19 PM           To: Rocco Serene, RN            Her if is slightly lower than previous.  Valve replacement is working well.  She has no new symptoms.  However in the light of her mildly reduced EF compared with previous I would like to see her in six months.  Call Ms. Poyer with the results and send results to Rudi Heap, MD       ------

## 2012-07-07 NOTE — Telephone Encounter (Signed)
Patient informed. 

## 2012-07-10 ENCOUNTER — Ambulatory Visit (INDEPENDENT_AMBULATORY_CARE_PROVIDER_SITE_OTHER): Payer: Medicare Other | Admitting: *Deleted

## 2012-07-10 DIAGNOSIS — I059 Rheumatic mitral valve disease, unspecified: Secondary | ICD-10-CM

## 2012-07-10 DIAGNOSIS — Z7901 Long term (current) use of anticoagulants: Secondary | ICD-10-CM

## 2012-07-10 DIAGNOSIS — Z954 Presence of other heart-valve replacement: Secondary | ICD-10-CM

## 2012-07-14 ENCOUNTER — Ambulatory Visit (INDEPENDENT_AMBULATORY_CARE_PROVIDER_SITE_OTHER): Payer: Medicare Other | Admitting: *Deleted

## 2012-07-14 DIAGNOSIS — Z7901 Long term (current) use of anticoagulants: Secondary | ICD-10-CM

## 2012-07-14 DIAGNOSIS — Z954 Presence of other heart-valve replacement: Secondary | ICD-10-CM

## 2012-07-14 DIAGNOSIS — I059 Rheumatic mitral valve disease, unspecified: Secondary | ICD-10-CM

## 2012-07-25 ENCOUNTER — Other Ambulatory Visit: Payer: Self-pay | Admitting: Cardiology

## 2012-07-27 ENCOUNTER — Telehealth: Payer: Self-pay

## 2012-07-27 NOTE — Telephone Encounter (Signed)
error 

## 2012-07-28 NOTE — Telephone Encounter (Signed)
..   Requested Prescriptions   Pending Prescriptions Disp Refills  . DIGOX 125 MCG tablet [Pharmacy Med Name: DIGOX 125 MCG TABLET] 90 tablet 3    Sig: TAKE 1 TABLET (125 MCG TOTAL) BY MOUTH DAILY.  Marland Kitchen KLOR-CON M10 10 MEQ tablet [Pharmacy Med Name: KLOR-CON M10 TABLET] 90 tablet 2    Sig: TAKE 1 TABLET EVERY DAY

## 2012-08-04 ENCOUNTER — Ambulatory Visit (INDEPENDENT_AMBULATORY_CARE_PROVIDER_SITE_OTHER): Payer: Medicare Other | Admitting: *Deleted

## 2012-08-04 DIAGNOSIS — I059 Rheumatic mitral valve disease, unspecified: Secondary | ICD-10-CM

## 2012-08-04 DIAGNOSIS — Z954 Presence of other heart-valve replacement: Secondary | ICD-10-CM

## 2012-08-04 DIAGNOSIS — Z7901 Long term (current) use of anticoagulants: Secondary | ICD-10-CM

## 2012-08-04 LAB — POCT INR: INR: 2.9

## 2012-09-01 ENCOUNTER — Ambulatory Visit (INDEPENDENT_AMBULATORY_CARE_PROVIDER_SITE_OTHER): Payer: Medicare Other | Admitting: *Deleted

## 2012-09-01 DIAGNOSIS — I059 Rheumatic mitral valve disease, unspecified: Secondary | ICD-10-CM

## 2012-09-01 DIAGNOSIS — Z7901 Long term (current) use of anticoagulants: Secondary | ICD-10-CM

## 2012-09-01 DIAGNOSIS — Z954 Presence of other heart-valve replacement: Secondary | ICD-10-CM

## 2012-09-25 ENCOUNTER — Telehealth: Payer: Self-pay | Admitting: *Deleted

## 2012-09-26 ENCOUNTER — Other Ambulatory Visit: Payer: Self-pay | Admitting: Cardiology

## 2012-09-28 ENCOUNTER — Other Ambulatory Visit: Payer: Self-pay | Admitting: *Deleted

## 2012-10-02 ENCOUNTER — Ambulatory Visit (INDEPENDENT_AMBULATORY_CARE_PROVIDER_SITE_OTHER): Payer: Self-pay | Admitting: *Deleted

## 2012-10-02 DIAGNOSIS — Z954 Presence of other heart-valve replacement: Secondary | ICD-10-CM

## 2012-10-02 DIAGNOSIS — I059 Rheumatic mitral valve disease, unspecified: Secondary | ICD-10-CM

## 2012-10-02 DIAGNOSIS — Z7901 Long term (current) use of anticoagulants: Secondary | ICD-10-CM

## 2012-10-16 ENCOUNTER — Telehealth: Payer: Self-pay | Admitting: Cardiology

## 2012-10-16 NOTE — Telephone Encounter (Signed)
New Prob     Pt states she was told to get an EKG done but doesn't know who told her to get this done & no orders found in EPIC. Please call.

## 2012-10-16 NOTE — Telephone Encounter (Signed)
Pt called because she said someone had call her to make an appointment for an EKG . I was not able to find a note regarding an EKG. Pt had a 2-D echo on 07/05/12 and Dr. Antoine Poche wants for pt to make an appointment with him in 6 months in [place], which will be in October 2014. Pt is aware, but she may forget, she needs to make the appointment for.

## 2012-10-19 NOTE — Telephone Encounter (Signed)
Dr. Antoine Poche will not be back to [facility] this year after August.  I have spoken to Ms. Zender and she would like to start seeing Dr Antoine Poche in the [place] location. Please contact patient in reference to scheduling her there.

## 2012-11-17 ENCOUNTER — Ambulatory Visit (INDEPENDENT_AMBULATORY_CARE_PROVIDER_SITE_OTHER): Payer: Medicare Other | Admitting: *Deleted

## 2012-11-17 DIAGNOSIS — Z7901 Long term (current) use of anticoagulants: Secondary | ICD-10-CM

## 2012-11-17 DIAGNOSIS — Z954 Presence of other heart-valve replacement: Secondary | ICD-10-CM

## 2012-11-17 DIAGNOSIS — I059 Rheumatic mitral valve disease, unspecified: Secondary | ICD-10-CM

## 2012-11-24 ENCOUNTER — Ambulatory Visit (INDEPENDENT_AMBULATORY_CARE_PROVIDER_SITE_OTHER): Payer: Medicare Other | Admitting: *Deleted

## 2012-11-24 DIAGNOSIS — Z7901 Long term (current) use of anticoagulants: Secondary | ICD-10-CM

## 2012-11-24 DIAGNOSIS — I059 Rheumatic mitral valve disease, unspecified: Secondary | ICD-10-CM

## 2012-11-24 DIAGNOSIS — Z954 Presence of other heart-valve replacement: Secondary | ICD-10-CM

## 2012-11-24 LAB — POCT INR: INR: 1.9

## 2012-12-08 ENCOUNTER — Ambulatory Visit (INDEPENDENT_AMBULATORY_CARE_PROVIDER_SITE_OTHER): Payer: Medicare Other | Admitting: *Deleted

## 2012-12-08 DIAGNOSIS — Z954 Presence of other heart-valve replacement: Secondary | ICD-10-CM

## 2012-12-08 DIAGNOSIS — Z7901 Long term (current) use of anticoagulants: Secondary | ICD-10-CM

## 2012-12-08 DIAGNOSIS — I059 Rheumatic mitral valve disease, unspecified: Secondary | ICD-10-CM

## 2012-12-08 LAB — POCT INR: INR: 1.6

## 2012-12-22 ENCOUNTER — Encounter: Payer: Self-pay | Admitting: *Deleted

## 2013-01-06 ENCOUNTER — Encounter: Payer: Self-pay | Admitting: Cardiology

## 2013-01-06 ENCOUNTER — Ambulatory Visit (INDEPENDENT_AMBULATORY_CARE_PROVIDER_SITE_OTHER): Payer: Medicare Other | Admitting: Cardiology

## 2013-01-06 VITALS — BP 123/75 | HR 62 | Ht 68.0 in | Wt 175.0 lb

## 2013-01-06 DIAGNOSIS — I059 Rheumatic mitral valve disease, unspecified: Secondary | ICD-10-CM

## 2013-01-06 DIAGNOSIS — R0989 Other specified symptoms and signs involving the circulatory and respiratory systems: Secondary | ICD-10-CM

## 2013-01-06 DIAGNOSIS — I4891 Unspecified atrial fibrillation: Secondary | ICD-10-CM

## 2013-01-06 NOTE — Patient Instructions (Addendum)
The current medical regimen is effective;  continue present plan and medications.  Your physician has requested that you have a carotid duplex. This test is an ultrasound of the carotid arteries in your neck. It looks at blood flow through these arteries that supply the brain with blood. Allow one hour for this exam. There are no restrictions or special instructions.  Please have blood work (CBC, TSH, BMP and BNP) Please have this drawn at [facility].  Take the written order with you.  Follow up in 2 months with Dr Antoine Poche in [place].

## 2013-01-06 NOTE — Progress Notes (Signed)
HPI The patient was previously seen by Dr. Deborah Chalk. She sees Dr. Johney Frame for her pacemaker.  Earlier this year I sent her for an echo to followup of her mitral valve replacement. She had had a previously mildly reduced ejection fraction. Seem to be slightly lower on his most recent echo at 35%. I brought her back to discuss this. She has multiple complaints. She says she has dyspnea with activity such as walking the trash out which is a fairly long distance. She has neck pain and leg pain with standing. She's had chronic balance problems with a brain tumor. She's not driving anymore. She's not describing chest pressure, neck or arm discomfort. She's not describing PND or orthopnea. She has some sprain palpitations and she wonders if her pacemaker working properly. She has not had recent followup as her previous primary provider moved away.    Allergies  Allergen Reactions  . Penicillins     Current Outpatient Prescriptions  Medication Sig Dispense Refill  . atorvastatin (LIPITOR) 10 MG tablet Take 10 mg by mouth daily.      Marland Kitchen COUMADIN 5 MG tablet TAKE AS DIRECTED BY ANTICOAGULATION CLINIC.  90 tablet  1  . DIGOX 125 MCG tablet TAKE 1 TABLET (125 MCG TOTAL) BY MOUTH DAILY.  90 tablet  3  . digoxin (LANOXIN) 0.125 MG tablet Take 0.125 mg by mouth daily.      Marland Kitchen KLOR-CON M10 10 MEQ tablet TAKE 1 TABLET EVERY DAY  90 tablet  2  . latanoprost (XALATAN) 0.005 % ophthalmic solution Place 1 drop into both eyes daily.       . metoprolol succinate (TOPROL-XL) 50 MG 24 hr tablet Take 50 mg by mouth daily. Take with or immediately following a meal.      . PARoxetine (PAXIL) 40 MG tablet TAKE 1 TABLET (40 MG TOTAL) BY MOUTH DAILY.  30 tablet  6   No current facility-administered medications for this visit.    Past Medical History  Diagnosis Date  . Mild aortic stenosis     AVA 1.1cm2 by echo 12/10  . Atrial fibrillation     permanent  . Depression   . Hyperlipidemia   . Rheumatic heart disease  09/1994    s/p mechanical mitral valve repair (#33 St Jude valve)  . Tinnitus of both ears   . Paget's bone disease     skull  . Hypertension   . Meningioma   . Bradycardia     s/p PPM    Past Surgical History  Procedure Laterality Date  . Pacemaker insertion      generator change 8/13 by Dr Johney Frame (MDT Jana Half [facility])  . Right ankle surgery      with pin and plate  . Abdominal hysterectomy      total  . Cholecystectomy    . Knee arthroscopy      total  . Mitral valve replacement  09/1994    #33 St Jude valve    ROS:  As stated in the HPI and negative for all other systems.  PHYSICAL EXAM BP 123/75  Pulse 62  Ht 5\' 8"  (1.727 m)  Wt 175 lb (79.379 kg)  BMI 26.61 kg/m2 GENERAL:  Well appearing HEENT:  Pupils equal round and reactive, fundi not visualized, oral mucosa unremarkable, dentures NECK:  No jugular venous distention, waveform within normal limits, carotid upstroke brisk and symmetric, right carotid bruits, no thyromegaly LYMPHATICS:  No cervical, inguinal adenopathy LUNGS:  Clear to auscultation bilaterally BACK:  No CVA tenderness CHEST:  Well healed sternotomy scar, right sided pacemaker HEART:  PMI not displaced or sustained, mechanical S1 and normal S2 within normal limits, no S3, no clicks, no rubs, no murmurs, irregular  ABD:  Flat, positive bowel sounds normal in frequency in pitch, no bruits, no rebound, no guarding, no midline pulsatile mass, no hepatomegaly, no splenomegaly EXT:  2 plus pulses throughout, no edema, no cyanosis no clubbing SKIN:  No rashes, multiple lipomas NEURO:  Cranial nerves II through XII grossly intact, motor grossly intact throughout PSYCH:  Cognitively intact, oriented to person place and time    ASSESSMENT AND PLAN  Mitral stenosis Mitral stenosis.   The mitral valve replacement appear to be normally functioning at the last echo. She will continue with anticoagulation.  Atrial fibrillation The patient  tolerates this rhythm  and rate control and anticoagulation. We will continue with the meds as listed.  Bradycardia She  Has followup tomorrow in [place] for her pacemaker.  Fatigue  I have encouraged her to go back to see her primary provider. I will check TSH CBC and basic metabolic profile. As she also has some dyspnea I will check a BNP level. See below.  Cardiomyopathy Her Ef is lower than previous.   However at this point with her fatigue and other somatic complaints and not going to titrate medications. I will do this if she's feeling better in the future.  Bruit  I will arrange a carotid Doppler.

## 2013-01-08 ENCOUNTER — Ambulatory Visit (INDEPENDENT_AMBULATORY_CARE_PROVIDER_SITE_OTHER): Payer: Medicare Other | Admitting: *Deleted

## 2013-01-08 DIAGNOSIS — Z7901 Long term (current) use of anticoagulants: Secondary | ICD-10-CM

## 2013-01-08 DIAGNOSIS — I059 Rheumatic mitral valve disease, unspecified: Secondary | ICD-10-CM

## 2013-01-08 DIAGNOSIS — Z954 Presence of other heart-valve replacement: Secondary | ICD-10-CM

## 2013-01-27 ENCOUNTER — Other Ambulatory Visit: Payer: Self-pay | Admitting: Cardiology

## 2013-01-27 ENCOUNTER — Encounter: Payer: Self-pay | Admitting: Cardiology

## 2013-01-27 ENCOUNTER — Encounter: Payer: Self-pay | Admitting: Internal Medicine

## 2013-01-27 ENCOUNTER — Ambulatory Visit (INDEPENDENT_AMBULATORY_CARE_PROVIDER_SITE_OTHER): Payer: Medicare Other | Admitting: Internal Medicine

## 2013-01-27 VITALS — BP 117/62 | HR 86 | Ht 68.0 in | Wt 167.0 lb

## 2013-01-27 DIAGNOSIS — I498 Other specified cardiac arrhythmias: Secondary | ICD-10-CM

## 2013-01-27 DIAGNOSIS — I4891 Unspecified atrial fibrillation: Secondary | ICD-10-CM

## 2013-01-27 DIAGNOSIS — Z950 Presence of cardiac pacemaker: Secondary | ICD-10-CM

## 2013-01-27 DIAGNOSIS — R001 Bradycardia, unspecified: Secondary | ICD-10-CM

## 2013-01-27 LAB — MDC_IDC_ENUM_SESS_TYPE_INCLINIC
Date Time Interrogation Session: 20141112091419
Lead Channel Impedance Value: 0 Ohm
Lead Channel Impedance Value: 484 Ohm
Lead Channel Pacing Threshold Amplitude: 0.75 V
Lead Channel Pacing Threshold Pulse Width: 0.4 ms

## 2013-01-27 NOTE — Progress Notes (Signed)
PCP: Rudi Heap, MD Primary Cardiologist:  Dr Arva Chafe is a 79 y.o. female who presents today for routine electrophysiology followup. She is doing reasonably well.    Today, she denies symptoms of palpitations, chest pain, shortness of breath,  lower extremity edema, dizziness, presyncope, fevers/chills, or syncope.  The patient is otherwise without complaint today.   Past Medical History  Diagnosis Date  . Mild aortic stenosis     AVA 1.1cm2 by echo 12/10  . Atrial fibrillation     permanent  . Depression   . Hyperlipidemia   . Rheumatic heart disease 09/1994    s/p mechanical mitral valve repair (#33 St Jude valve)  . Tinnitus of both ears   . Paget's bone disease     skull  . Hypertension   . Meningioma   . Bradycardia     s/p PPM   Past Surgical History  Procedure Laterality Date  . Pacemaker insertion      generator change 8/13 by Dr Johney Frame (MDT Jana Half SR)  . Right ankle surgery      with pin and plate  . Abdominal hysterectomy      total  . Cholecystectomy    . Knee arthroscopy      total  . Mitral valve replacement  09/1994    #33 St Jude valve    Current Outpatient Prescriptions  Medication Sig Dispense Refill  . atorvastatin (LIPITOR) 10 MG tablet Take 10 mg by mouth daily.      Marland Kitchen COUMADIN 5 MG tablet TAKE AS DIRECTED BY ANTICOAGULATION CLINIC.  90 tablet  1  . digoxin (LANOXIN) 0.125 MG tablet Take 0.125 mg by mouth daily.      Marland Kitchen KLOR-CON M10 10 MEQ tablet TAKE 1 TABLET EVERY DAY  90 tablet  2  . latanoprost (XALATAN) 0.005 % ophthalmic solution Place 1 drop into both eyes daily.       Marland Kitchen PARoxetine (PAXIL) 40 MG tablet TAKE 1 TABLET (40 MG TOTAL) BY MOUTH DAILY.  30 tablet  6   No current facility-administered medications for this visit.    Physical Exam: Filed Vitals:   01/27/13 0854  BP: 117/62  Pulse: 86  Height: 5\' 8"  (1.727 m)  Weight: 167 lb (75.751 kg)  SpO2: 100%    GEN- The patient is well appearing, alert and oriented x 3  today.   Head- normocephalic, atraumatic Eyes-  Sclera clear, conjunctiva pink Ears- hearing intact Oropharynx- clear Lungs- Clear to ausculation bilaterally, normal work of breathing Chest- pacemaker pocket is well healed Heart- Regular rate and rhythm (paced), mechanical S1 GI- soft, NT, ND, + BS Extremities- no clubbing, cyanosis, or edema  Pacemaker interrogation- reviewed in detail today,  See PACEART report  Assessment and Plan:  1. Permanent afib Continue long term anticoagulation  2. Tachycardia/ bradycardia syndrome Normal pacemaker function See Pace Art report No changes today  carelink Return to the device clinic in 6 months I will see in a year

## 2013-01-27 NOTE — Patient Instructions (Signed)
Continue all current medications. Your physician wants you to follow up in: 6 months.  You will receive a reminder letter in the mail one-two months in advance.  If you don't receive a letter, please call our office to schedule the follow up appointment - [place]in Your physician wants you to follow up in:  1 year.  You will receive a reminder letter in the mail one-two months in advance.  If you don't receive a letter, please call our office to schedule the follow up appointment - [place]  

## 2013-01-28 ENCOUNTER — Encounter: Payer: Self-pay | Admitting: Cardiology

## 2013-01-29 ENCOUNTER — Ambulatory Visit (INDEPENDENT_AMBULATORY_CARE_PROVIDER_SITE_OTHER): Payer: Medicare Other | Admitting: *Deleted

## 2013-01-29 ENCOUNTER — Telehealth: Payer: Self-pay | Admitting: Cardiology

## 2013-01-29 DIAGNOSIS — Z7901 Long term (current) use of anticoagulants: Secondary | ICD-10-CM

## 2013-01-29 DIAGNOSIS — Z954 Presence of other heart-valve replacement: Secondary | ICD-10-CM

## 2013-01-29 DIAGNOSIS — I059 Rheumatic mitral valve disease, unspecified: Secondary | ICD-10-CM

## 2013-01-29 LAB — POCT INR: INR: 1.2

## 2013-01-29 NOTE — Telephone Encounter (Signed)
Pt was in office asking about flu shot. Called pt and informed her that we only give flu shots if the Dr orders it. Informed her she can check with her PCP, or local pharmacy, or wait to see Dr.Hochrein to have it done in December. Pt verbalized understanding.

## 2013-02-02 ENCOUNTER — Ambulatory Visit (INDEPENDENT_AMBULATORY_CARE_PROVIDER_SITE_OTHER): Payer: Medicare Other | Admitting: *Deleted

## 2013-02-02 DIAGNOSIS — Z7901 Long term (current) use of anticoagulants: Secondary | ICD-10-CM

## 2013-02-02 DIAGNOSIS — I059 Rheumatic mitral valve disease, unspecified: Secondary | ICD-10-CM

## 2013-02-02 DIAGNOSIS — Z954 Presence of other heart-valve replacement: Secondary | ICD-10-CM

## 2013-02-09 ENCOUNTER — Encounter: Payer: Self-pay | Admitting: Internal Medicine

## 2013-02-10 ENCOUNTER — Encounter (INDEPENDENT_AMBULATORY_CARE_PROVIDER_SITE_OTHER): Payer: Medicare Other

## 2013-02-10 DIAGNOSIS — R0989 Other specified symptoms and signs involving the circulatory and respiratory systems: Secondary | ICD-10-CM

## 2013-02-10 DIAGNOSIS — I6529 Occlusion and stenosis of unspecified carotid artery: Secondary | ICD-10-CM

## 2013-03-01 ENCOUNTER — Other Ambulatory Visit: Payer: Self-pay | Admitting: Cardiology

## 2013-03-03 ENCOUNTER — Ambulatory Visit (INDEPENDENT_AMBULATORY_CARE_PROVIDER_SITE_OTHER): Payer: Medicare Other | Admitting: Cardiology

## 2013-03-03 ENCOUNTER — Encounter: Payer: Self-pay | Admitting: Cardiology

## 2013-03-03 VITALS — BP 147/71 | HR 79 | Ht 68.0 in | Wt 172.0 lb

## 2013-03-03 DIAGNOSIS — Z950 Presence of cardiac pacemaker: Secondary | ICD-10-CM

## 2013-03-03 DIAGNOSIS — I059 Rheumatic mitral valve disease, unspecified: Secondary | ICD-10-CM

## 2013-03-03 DIAGNOSIS — I4891 Unspecified atrial fibrillation: Secondary | ICD-10-CM

## 2013-03-03 DIAGNOSIS — Z954 Presence of other heart-valve replacement: Secondary | ICD-10-CM

## 2013-03-03 MED ORDER — LISINOPRIL 2.5 MG PO TABS: 2.5000 mg | ORAL_TABLET | Freq: Every day | ORAL | Status: AC

## 2013-03-03 NOTE — Patient Instructions (Addendum)
Please start Lisinopril 2.5 mg once a day. Continue all other medications as listed.  Please have blood work in 2 weeks (BMP).  Follow up in 2 months with Dr Antoine Poche in [place] office.

## 2013-03-03 NOTE — Progress Notes (Signed)
HPI The patient was previously seen by Dr. Deborah Chalk. She sees Dr. Johney Frame for her pacemaker.  Earlier this year I sent her for an echo to followup of her mitral valve replacement. She had had a previously mildly reduced ejection fraction. This seemed to be slightly lower at 35%. I brought her back to discuss this. She has multiple somatic complaints at that time such as fatigue and I refer her back to her primary care provider. Labs including a TSH were unremarkable. Because of her fatigue and other problems I did not titrate her medications earlier for management of her slightly lower ejection fraction.   She returns for followup if she has no new complaints. She's not describing any PND or orthopnea. She's not describing any new chest pressure, neck or arm discomfort. She's had no new palpitations, presyncope or syncope. She tolerates her anticoagulation. She's had no new weight gain or edema. Her fatigue is baseline.  Allergies  Allergen Reactions  . Penicillins     Current Outpatient Prescriptions  Medication Sig Dispense Refill  . atorvastatin (LIPITOR) 10 MG tablet Take 10 mg by mouth daily.      Marland Kitchen COUMADIN 5 MG tablet TAKE AS DIRECTED BY ANTICOAGULATION CLINIC.  90 tablet  1  . digoxin (LANOXIN) 0.125 MG tablet Take 0.125 mg by mouth daily.      Marland Kitchen latanoprost (XALATAN) 0.005 % ophthalmic solution Place 1 drop into both eyes daily.       Marland Kitchen PARoxetine (PAXIL) 40 MG tablet TAKE 1 TABLET (40 MG TOTAL) BY MOUTH DAILY.  30 tablet  6  . potassium chloride (KLOR-CON M10) 10 MEQ tablet TAKE 1 TABLET EVERY DAY  90 tablet  2   No current facility-administered medications for this visit.    Past Medical History  Diagnosis Date  . Mild aortic stenosis     AVA 1.1cm2 by echo 12/10  . Atrial fibrillation     permanent  . Depression   . Hyperlipidemia   . Rheumatic heart disease 09/1994    s/p mechanical mitral valve repair (#33 St Jude valve)  . Tinnitus of both ears   . Paget's bone  disease     skull  . Hypertension   . Meningioma   . Bradycardia     s/p PPM    Past Surgical History  Procedure Laterality Date  . Pacemaker insertion      generator change 8/13 by Dr Johney Frame (MDT Jana Half SR)  . Right ankle surgery      with pin and plate  . Abdominal hysterectomy      total  . Cholecystectomy    . Knee arthroscopy      total  . Mitral valve replacement  09/1994    #33 St Jude valve    ROS:  As stated in the HPI and negative for all other systems.  PHYSICAL EXAM BP 147/71  Pulse 79  Ht 5\' 8"  (1.727 m)  Wt 172 lb (78.019 kg)  BMI 26.16 kg/m2 GENERAL:  Well appearing HEENT:  Pupils equal round and reactive, fundi not visualized, oral mucosa unremarkable, dentures NECK:  No jugular venous distention, waveform within normal limits, carotid upstroke brisk and symmetric, right carotid bruits, no thyromegaly LUNGS:  Clear to auscultation bilaterally BACK:  No CVA tenderness CHEST:  Well healed sternotomy scar, right sided pacemaker HEART:  PMI not displaced or sustained, mechanical S1 and normal S2 within normal limits, no S3, no clicks, no rubs, no murmurs, irregular  ABD:  Flat, positive bowel sounds normal in frequency in pitch, no bruits, no rebound, no guarding, no midline pulsatile mass, no hepatomegaly, no splenomegaly EXT:  2 plus pulses throughout, no edema, no cyanosis no clubbing SKIN:  No rashes, multiple lipomas   EKG:  Atrial fibrillation, rate 79, rightward axis, no acute ST-T wave changes, inferolateral T wave inversions unchanged from previous EKGs.  The most recent EKG was paced.   03/03/2013  ASSESSMENT AND PLAN  Mitral stenosis Mitral stenosis.   The mitral valve replacement appear to be normally functioning at the last echo. She will continue with anticoagulation.  Atrial fibrillation The patient  tolerates this rhythm and rate control and anticoagulation. We will continue with the meds as listed.  Bradycardia She is up to date with  pacemaker follow up.   Fatigue  I have encouraged her to go back to see her primary provider. I will check TSH CBC and basic metabolic profile. As she also has some dyspnea I will check a BNP level. See below.  Cardiomyopathy Her EF is lower than previous.   Given this I going to start today lisinopril 2.5 mg daily. She will get a basic metabolic profile in 2 weeks. I will try to slowly titrate this medication. Of note she was on beta blockers in the past and I'm not sure why this was discontinued but we will review this in the future.  Carotid stenosis She had moderate stenosis when I checked it in November. This will be followed in one year.

## 2013-03-28 ENCOUNTER — Other Ambulatory Visit: Payer: Self-pay | Admitting: Cardiology

## 2013-04-06 ENCOUNTER — Ambulatory Visit (INDEPENDENT_AMBULATORY_CARE_PROVIDER_SITE_OTHER): Payer: Medicare Other | Admitting: *Deleted

## 2013-04-06 DIAGNOSIS — Z7901 Long term (current) use of anticoagulants: Secondary | ICD-10-CM

## 2013-04-06 DIAGNOSIS — Z954 Presence of other heart-valve replacement: Secondary | ICD-10-CM

## 2013-04-06 DIAGNOSIS — I059 Rheumatic mitral valve disease, unspecified: Secondary | ICD-10-CM

## 2013-04-06 LAB — POCT INR: INR: 4.5

## 2013-04-23 ENCOUNTER — Ambulatory Visit (INDEPENDENT_AMBULATORY_CARE_PROVIDER_SITE_OTHER): Payer: Medicare Other | Admitting: *Deleted

## 2013-04-23 DIAGNOSIS — I059 Rheumatic mitral valve disease, unspecified: Secondary | ICD-10-CM

## 2013-04-23 DIAGNOSIS — Z5181 Encounter for therapeutic drug level monitoring: Secondary | ICD-10-CM | POA: Insufficient documentation

## 2013-04-23 DIAGNOSIS — Z7901 Long term (current) use of anticoagulants: Secondary | ICD-10-CM

## 2013-04-23 DIAGNOSIS — Z954 Presence of other heart-valve replacement: Secondary | ICD-10-CM

## 2013-04-23 LAB — POCT INR: INR: 2.3

## 2013-04-29 ENCOUNTER — Encounter: Payer: Medicare Other | Admitting: *Deleted

## 2013-04-29 ENCOUNTER — Telehealth: Payer: Self-pay | Admitting: Internal Medicine

## 2013-04-29 NOTE — Telephone Encounter (Signed)
Mrs. Annette Short called today in regards to her appointment for today to do the remote pacer check. States she does not understand this procedure. Would like for you to call her.

## 2013-05-05 ENCOUNTER — Encounter: Payer: Self-pay | Admitting: *Deleted

## 2013-05-05 ENCOUNTER — Ambulatory Visit: Payer: Medicare Other | Admitting: Cardiology

## 2013-05-10 NOTE — Telephone Encounter (Signed)
Spoke w/pt and daughter to send transmission some time this week.

## 2013-05-14 ENCOUNTER — Telehealth: Payer: Self-pay | Admitting: Internal Medicine

## 2013-05-14 NOTE — Telephone Encounter (Signed)
Spoke w/pt and instructed to call [employer] to get help.

## 2013-05-14 NOTE — Telephone Encounter (Signed)
Patient would like to know if her remote call reported anything.  She thinks that her equipment may have not been working correctly

## 2013-05-28 ENCOUNTER — Ambulatory Visit (INDEPENDENT_AMBULATORY_CARE_PROVIDER_SITE_OTHER): Payer: Medicare Other | Admitting: *Deleted

## 2013-05-28 DIAGNOSIS — Z954 Presence of other heart-valve replacement: Secondary | ICD-10-CM

## 2013-05-28 DIAGNOSIS — I059 Rheumatic mitral valve disease, unspecified: Secondary | ICD-10-CM

## 2013-05-28 DIAGNOSIS — Z5181 Encounter for therapeutic drug level monitoring: Secondary | ICD-10-CM

## 2013-05-28 DIAGNOSIS — Z7901 Long term (current) use of anticoagulants: Secondary | ICD-10-CM

## 2013-05-28 LAB — POCT INR: INR: 2

## 2013-06-15 ENCOUNTER — Ambulatory Visit (INDEPENDENT_AMBULATORY_CARE_PROVIDER_SITE_OTHER): Payer: Medicare Other | Admitting: *Deleted

## 2013-06-15 DIAGNOSIS — I059 Rheumatic mitral valve disease, unspecified: Secondary | ICD-10-CM

## 2013-06-15 DIAGNOSIS — Z954 Presence of other heart-valve replacement: Secondary | ICD-10-CM

## 2013-06-15 DIAGNOSIS — Z5181 Encounter for therapeutic drug level monitoring: Secondary | ICD-10-CM

## 2013-06-15 DIAGNOSIS — Z7901 Long term (current) use of anticoagulants: Secondary | ICD-10-CM

## 2013-06-15 LAB — POCT INR: INR: 1.9

## 2013-06-30 ENCOUNTER — Ambulatory Visit (INDEPENDENT_AMBULATORY_CARE_PROVIDER_SITE_OTHER): Payer: Medicare Other | Admitting: Cardiology

## 2013-06-30 VITALS — BP 113/59 | HR 83 | Ht 68.0 in | Wt 164.0 lb

## 2013-06-30 DIAGNOSIS — I4891 Unspecified atrial fibrillation: Secondary | ICD-10-CM

## 2013-06-30 DIAGNOSIS — I059 Rheumatic mitral valve disease, unspecified: Secondary | ICD-10-CM

## 2013-06-30 NOTE — Progress Notes (Signed)
HPI The patient presents for followup of her mitral valve replacement. She had had a previously mildly reduced ejection fraction. This seemed to be slightly lower at 35% most recently.  Since I last saw her she says she was hospitalized a more head not clear what the problem was though she says her blood pressure was elevated..  She again has multiple somatic complaints.  Most of these are complaints of leg and joint pain.  She's not describing any PND or orthopnea. She's not describing any new chest pressure, neck or arm discomfort. She's had no new palpitations, presyncope or syncope. She tolerates her anticoagulation. She's had no new weight gain or edema. Her fatigue is baseline.  Allergies  Allergen Reactions  . Penicillins     Current Outpatient Prescriptions  Medication Sig Dispense Refill  . atorvastatin (LIPITOR) 10 MG tablet TAKE 1 TABLET (10 MG TOTAL) BY MOUTH DAILY.  90 tablet  0  . COUMADIN 5 MG tablet TAKE AS DIRECTED BY ANTICOAGULATION CLINIC.  90 tablet  1  . digoxin (LANOXIN) 0.125 MG tablet Take 0.125 mg by mouth daily.      Marland Kitchen latanoprost (XALATAN) 0.005 % ophthalmic solution Place 1 drop into both eyes daily.       Marland Kitchen lisinopril (PRINIVIL,ZESTRIL) 2.5 MG tablet Take 1 tablet (2.5 mg total) by mouth daily.  90 tablet  3  . PARoxetine (PAXIL) 40 MG tablet TAKE 1 TABLET (40 MG TOTAL) BY MOUTH DAILY.  30 tablet  6  . potassium chloride (KLOR-CON M10) 10 MEQ tablet TAKE 1 TABLET EVERY DAY  90 tablet  2   No current facility-administered medications for this visit.    Past Medical History  Diagnosis Date  . Mild aortic stenosis     AVA 1.1cm2 by echo 12/10  . Atrial fibrillation     permanent  . Depression   . Hyperlipidemia   . Rheumatic heart disease 09/1994    s/p mechanical mitral valve repair (#33 St Jude valve)  . Tinnitus of both ears   . Paget's bone disease     skull  . Hypertension   . Meningioma   . Bradycardia     s/p PPM    Past Surgical History    Procedure Laterality Date  . Pacemaker insertion      generator change 8/13 by Dr Rayann Heman (MDT Sherril Croon [facility])  . Right ankle surgery      with pin and plate  . Abdominal hysterectomy      total  . Cholecystectomy    . Knee arthroscopy      total  . Mitral valve replacement  09/1994    #33 St Jude valve    ROS:  As stated in the HPI and negative for all other systems.  PHYSICAL EXAM BP 113/59  Pulse 83  Ht 5\' 8"  (1.727 m)  Wt 164 lb (74.39 kg)  BMI 24.94 kg/m2 GENERAL:  Well appearing HEENT:  Pupils equal round and reactive, fundi not visualized, oral mucosa unremarkable, dentures NECK:  No jugular venous distention, waveform within normal limits, carotid upstroke brisk and symmetric, right carotid bruits, no thyromegaly LUNGS:  Clear to auscultation bilaterally BACK:  No CVA tenderness CHEST:  Well healed sternotomy scar, right sided pacemaker HEART:  PMI not displaced or sustained, mechanical S1 and normal S2 within normal limits, no S3, no clicks, no rubs, no murmurs, irregular  ABD:  Flat, positive bowel sounds normal in frequency in pitch, no bruits, no rebound, no  guarding, no midline pulsatile mass, no hepatomegaly, no splenomegaly EXT:  2 plus pulses throughout, no edema, no cyanosis no clubbing SKIN:  No rashes, multiple lipomas   EKG:  Atrial fibrillation, rate 83.  Demand ventricular pacing   06/30/2013  ASSESSMENT AND PLAN  Mitral stenosis No further imagining is indicated at this point.  She will continue with meds as listed.   Atrial fibrillation The patient  tolerates this rhythm and rate control and anticoagulation. We will continue with the meds as listed.  Bradycardia She is up to date with pacemaker follow up.   Cardiomyopathy I had previously try to titrate her medications. However, with her ongoing fatigue and lower blood pressure on not going to do that at this point although I might again in the future.  Carotid stenosis She had 40-59% bilateral  stenosis which will be checked again in November of this year.

## 2013-06-30 NOTE — Patient Instructions (Signed)
The current medical regimen is effective;  continue present plan and medications.  Follow up in 1 year with Dr Hochrein.  You will receive a letter in the mail 2 months before you are due.  Please call us when you receive this letter to schedule your follow up appointment.  

## 2013-07-02 ENCOUNTER — Encounter: Payer: Self-pay | Admitting: *Deleted

## 2013-07-02 ENCOUNTER — Ambulatory Visit (INDEPENDENT_AMBULATORY_CARE_PROVIDER_SITE_OTHER): Payer: Medicare Other | Admitting: *Deleted

## 2013-07-02 DIAGNOSIS — Z7901 Long term (current) use of anticoagulants: Secondary | ICD-10-CM

## 2013-07-02 DIAGNOSIS — Z954 Presence of other heart-valve replacement: Secondary | ICD-10-CM

## 2013-07-02 DIAGNOSIS — I059 Rheumatic mitral valve disease, unspecified: Secondary | ICD-10-CM

## 2013-07-02 DIAGNOSIS — Z5181 Encounter for therapeutic drug level monitoring: Secondary | ICD-10-CM

## 2013-07-02 LAB — POCT INR: INR: 1.6

## 2013-07-05 ENCOUNTER — Encounter: Payer: Self-pay | Admitting: Cardiology

## 2013-07-12 ENCOUNTER — Ambulatory Visit: Payer: Self-pay | Admitting: Family Medicine

## 2013-07-16 ENCOUNTER — Ambulatory Visit (INDEPENDENT_AMBULATORY_CARE_PROVIDER_SITE_OTHER): Payer: Medicare Other | Admitting: *Deleted

## 2013-07-16 DIAGNOSIS — I059 Rheumatic mitral valve disease, unspecified: Secondary | ICD-10-CM

## 2013-07-16 DIAGNOSIS — Z7901 Long term (current) use of anticoagulants: Secondary | ICD-10-CM

## 2013-07-16 DIAGNOSIS — Z5181 Encounter for therapeutic drug level monitoring: Secondary | ICD-10-CM

## 2013-07-16 DIAGNOSIS — Z954 Presence of other heart-valve replacement: Secondary | ICD-10-CM

## 2013-07-16 LAB — POCT INR: INR: 1.3

## 2013-07-19 ENCOUNTER — Ambulatory Visit (INDEPENDENT_AMBULATORY_CARE_PROVIDER_SITE_OTHER): Payer: Medicare Other | Admitting: Family Medicine

## 2013-07-19 ENCOUNTER — Encounter: Payer: Self-pay | Admitting: Family Medicine

## 2013-07-19 VITALS — BP 115/64 | HR 90 | Temp 96.9°F | Ht 68.0 in | Wt 163.6 lb

## 2013-07-19 DIAGNOSIS — R5381 Other malaise: Secondary | ICD-10-CM

## 2013-07-19 DIAGNOSIS — R5383 Other fatigue: Principal | ICD-10-CM

## 2013-07-19 LAB — POCT GLYCOSYLATED HEMOGLOBIN (HGB A1C): Hemoglobin A1C: 5.3

## 2013-07-19 NOTE — Progress Notes (Signed)
Patient name: Annette Short Medical record number: [id number] Date of birth: 16-Jan-1934 Age: 80 y.o. Gender: female  Primary Care Provider: Redge Gainer, MD  History:   Patient presents today for new patient visit. The patient has a past medical history of multiple cardiac issues including mitral valve disease status post mitral valve replacement, atrial fibrillation on chronic Coumadin, bradycardia status post permanent pacemaker. Is followed by Dr. Percival Spanish.  Also with baseline hx/o meningioma.  Pt states that she was told by cardiologist that she needed to set up a primary care provider.  No acute issues today, though pt does report some chronic fatigue that has been presenting >1-2 years.  Denies any CP, SOB, LE swelling, orthopnea, PND.  Is refusing colonoscopy info  Would like to know cost of vaccines before being administered.   Past Medical History: Patient Active Problem List   Diagnosis Date Noted  . Encounter for therapeutic drug monitoring 04/23/2013  . Heart valve replaced by other means 08/03/2010  . Bradycardia 07/20/2010  . Mitral valve disorders 06/08/2010  . HYPERLIPIDEMIA-MIXED 04/26/2010  . Atrial fibrillation 04/26/2010  . PACEMAKER-Medtronic 04/26/2010   Past Medical History  Diagnosis Date  . Mild aortic stenosis     AVA 1.1cm2 by echo 12/10  . Atrial fibrillation     permanent  . Depression   . Hyperlipidemia   . Rheumatic heart disease 09/1994    s/p mechanical mitral valve repair (#33 St Jude valve)  . Tinnitus of both ears   . Paget's bone disease     skull  . Hypertension   . Meningioma   . Bradycardia     s/p PPM    Past Surgical History: Past Surgical History  Procedure Laterality Date  . Pacemaker insertion      generator change 8/13 by Dr Rayann Heman (MDT Sherril Croon SR)  . Right ankle surgery      with pin and plate  . Abdominal hysterectomy      total  . Cholecystectomy    . Knee arthroscopy      total  . Mitral valve replacement   09/1994    #33 St Jude valve    Social History: History   Social History  . Marital Status: Widowed    Spouse Name: N/A    Number of Children: N/A  . Years of Education: N/A   Social History Main Topics  . Smoking status: Heavy Tobacco Smoker -- 0.50 packs/day    Types: Cigarettes    Last Attempt to Quit: 06/25/1993  . Smokeless tobacco: None  . Alcohol Use: No  . Drug Use: No  . Sexual Activity: None   Other Topics Concern  . None   Social History Narrative   widow    Family History: History reviewed. No pertinent family history.  Allergies: Allergies  Allergen Reactions  . Penicillins     Current Outpatient Prescriptions  Medication Sig Dispense Refill  . atorvastatin (LIPITOR) 10 MG tablet TAKE 1 TABLET (10 MG TOTAL) BY MOUTH DAILY.  90 tablet  0  . COUMADIN 5 MG tablet TAKE AS DIRECTED BY ANTICOAGULATION CLINIC.  90 tablet  1  . digoxin (LANOXIN) 0.125 MG tablet Take 0.125 mg by mouth daily.      Marland Kitchen latanoprost (XALATAN) 0.005 % ophthalmic solution Place 1 drop into both eyes daily.       Marland Kitchen lisinopril (PRINIVIL,ZESTRIL) 2.5 MG tablet Take 1 tablet (2.5 mg total) by mouth daily.  90 tablet  3  .  PARoxetine (PAXIL) 40 MG tablet TAKE 1 TABLET (40 MG TOTAL) BY MOUTH DAILY.  30 tablet  6  . potassium chloride (KLOR-CON M10) 10 MEQ tablet TAKE 1 TABLET EVERY DAY  90 tablet  2   No current facility-administered medications for this visit.   Review Of Systems: 12 point ROS negative except as noted above in HPI.  Physical Exam: Filed Vitals:   07/19/13 1033  BP: 115/64  Pulse: 90  Temp: 96.9 F (36.1 C)    General: underweight, alert and active  HEENT: PERRLA and extra ocular movement intact Heart: Mild systolic murmur, mildly irregular rate  Lungs: clear to auscultation, no wheezes or rales and unlabored breathing Abdomen: abdomen is soft without significant tenderness, masses, organomegaly or guarding Extremities: extremities normal, atraumatic, no  cyanosis or edema Skin:no rashes, no ecchymoses Neurology: normal without focal findings  Labs and Imaging: Lab Results  Component Value Date/Time   NA 141 11/08/2011 10:16 AM   K 4.4 11/08/2011 10:16 AM   CL 104 11/08/2011 10:16 AM   CO2 26 11/08/2011 10:16 AM   BUN 13 11/08/2011 10:16 AM   CREATININE 0.94 11/08/2011 10:16 AM   GLUCOSE 102* 11/08/2011 10:16 AM   Lab Results  Component Value Date   WBC 4.4 11/08/2011   HGB 12.0 11/08/2011   HCT 35.5* 11/08/2011   MCV 90.8 11/08/2011   PLT 186 11/08/2011    No results found.   Assessment and Plan: Patient Active Problem List   Diagnosis Date Noted  . Encounter for therapeutic drug monitoring 04/23/2013  . Heart valve replaced by other means 08/03/2010  . Bradycardia 07/20/2010  . Mitral valve disorders 06/08/2010  . HYPERLIPIDEMIA-MIXED 04/26/2010  . Atrial fibrillation 04/26/2010  . PACEMAKER-Medtronic 04/26/2010   Other malaise and fatigue - Plan: TSH, Vitamin D pnl(25-hydrxy+1,25-dihy)-bld, Comprehensive metabolic panel, POCT glycosylated hemoglobin (Hb A1C)   Seems to be stable from a cardiac standpoint today.  Has very regular follow up.  Suspect that fatigue is more of a chronic issues, especially given comorobidities. This did not seem to be a mjor concern for pt today. I will check baseline labs including vit D, TSH, CMET, CBC to rule out other causes.  Mood seems to be stable on paxil.  Has follow up for mengioma per pt.  Follow up as needed.          Shanda Howells MD

## 2013-07-19 NOTE — Progress Notes (Signed)
Shingles vaccine is $33 today.

## 2013-07-20 LAB — COMPREHENSIVE METABOLIC PANEL
A/G RATIO: 2 (ref 1.1–2.5)
ALT: 14 IU/L (ref 0–32)
AST: 26 IU/L (ref 0–40)
Albumin: 4.1 g/dL (ref 3.5–4.8)
Alkaline Phosphatase: 77 IU/L (ref 39–117)
BUN/Creatinine Ratio: 17 (ref 11–26)
BUN: 16 mg/dL (ref 8–27)
CALCIUM: 8.9 mg/dL (ref 8.7–10.3)
CO2: 24 mmol/L (ref 18–29)
Chloride: 105 mmol/L (ref 97–108)
Creatinine, Ser: 0.96 mg/dL (ref 0.57–1.00)
GFR calc Af Amer: 66 mL/min/{1.73_m2} (ref >59)
GFR, EST NON AFRICAN AMERICAN: 57 mL/min/{1.73_m2} — AB (ref >59)
GLUCOSE: 94 mg/dL (ref 65–99)
Globulin, Total: 2.1 g/dL (ref 1.5–4.5)
POTASSIUM: 4.4 mmol/L (ref 3.5–5.2)
Sodium: 143 mmol/L (ref 134–144)
Total Bilirubin: 2.1 mg/dL — ABNORMAL HIGH (ref 0.0–1.2)
Total Protein: 6.2 g/dL (ref 6.0–8.5)

## 2013-07-20 LAB — TSH: TSH: 0.719 u[IU]/mL (ref 0.450–4.500)

## 2013-07-21 ENCOUNTER — Other Ambulatory Visit: Payer: Self-pay | Admitting: *Deleted

## 2013-07-21 ENCOUNTER — Telehealth: Payer: Self-pay | Admitting: *Deleted

## 2013-07-21 MED ORDER — COUMADIN 5 MG PO TABS: ORAL_TABLET | ORAL | Status: AC

## 2013-07-21 NOTE — Telephone Encounter (Signed)
Rx has been sent. Pt aware 

## 2013-07-21 NOTE — Telephone Encounter (Signed)
Patients daughter called and stated that the patient has lost her coumadin and would like a new rx to be sent to [facility] in [place]. Thanks, MI

## 2013-07-23 ENCOUNTER — Ambulatory Visit (INDEPENDENT_AMBULATORY_CARE_PROVIDER_SITE_OTHER): Payer: Medicare Other | Admitting: *Deleted

## 2013-07-23 DIAGNOSIS — I059 Rheumatic mitral valve disease, unspecified: Secondary | ICD-10-CM

## 2013-07-23 DIAGNOSIS — Z954 Presence of other heart-valve replacement: Secondary | ICD-10-CM

## 2013-07-23 DIAGNOSIS — Z7901 Long term (current) use of anticoagulants: Secondary | ICD-10-CM

## 2013-07-23 DIAGNOSIS — Z5181 Encounter for therapeutic drug level monitoring: Secondary | ICD-10-CM

## 2013-07-23 LAB — POCT INR: INR: 1.5

## 2013-07-30 ENCOUNTER — Ambulatory Visit (INDEPENDENT_AMBULATORY_CARE_PROVIDER_SITE_OTHER): Payer: Medicare Other | Admitting: *Deleted

## 2013-07-30 DIAGNOSIS — Z7901 Long term (current) use of anticoagulants: Secondary | ICD-10-CM

## 2013-07-30 DIAGNOSIS — I059 Rheumatic mitral valve disease, unspecified: Secondary | ICD-10-CM

## 2013-07-30 DIAGNOSIS — Z954 Presence of other heart-valve replacement: Secondary | ICD-10-CM

## 2013-07-30 DIAGNOSIS — Z5181 Encounter for therapeutic drug level monitoring: Secondary | ICD-10-CM

## 2013-07-30 LAB — POCT INR: INR: 5.6

## 2013-08-05 ENCOUNTER — Ambulatory Visit (INDEPENDENT_AMBULATORY_CARE_PROVIDER_SITE_OTHER): Payer: Medicare Other | Admitting: Physician Assistant

## 2013-08-05 ENCOUNTER — Encounter: Payer: Self-pay | Admitting: Physician Assistant

## 2013-08-05 ENCOUNTER — Ambulatory Visit (INDEPENDENT_AMBULATORY_CARE_PROVIDER_SITE_OTHER): Payer: Medicare Other

## 2013-08-05 VITALS — BP 107/62 | HR 94 | Temp 97.9°F | Ht 68.0 in | Wt 156.0 lb

## 2013-08-05 DIAGNOSIS — M5410 Radiculopathy, site unspecified: Secondary | ICD-10-CM

## 2013-08-05 DIAGNOSIS — IMO0002 Reserved for concepts with insufficient information to code with codable children: Secondary | ICD-10-CM

## 2013-08-05 NOTE — Patient Instructions (Signed)
Hip Pain  The hips join the upper legs to the lower pelvis. The bones, cartilage, tendons, and muscles of the hip joint perform a lot of work each day holding your body weight and allowing you to move around.  Hip pain is a common symptom. It can range from a minor ache to severe pain on 1 or both hips. Pain may be felt on the inside of the hip joint near the groin, or the outside near the buttocks and upper thigh. There may be swelling or stiffness as well. It occurs more often when a person walks or performs activity. There are many reasons hip pain can develop.  CAUSES   It is important to work with your caregiver to identify the cause since many conditions can impact the bones, cartilage, muscles, and tendons of the hips. Causes for hip pain include:   Broken (fractured) bones.   Separation of the thighbone from the hip socket (dislocation).   Torn cartilage of the hip joint.   Swelling (inflammation) of a tendon (tendonitis), the sac within the hip joint (bursitis), or a joint.   A weakening in the abdominal wall (hernia), affecting the nerves to the hip.   Arthritis in the hip joint or lining of the hip joint.   Pinched nerves in the back, hip, or upper thigh.   A bulging disc in the spine (herniated disc).   Rarely, bone infection or cancer.  DIAGNOSIS   The location of your hip pain will help your caregiver understand what may be causing the pain. A diagnosis is based on your medical history, your symptoms, results from your physical exam, and results from diagnostic tests. Diagnostic tests may include X-ray exams, a computerized magnetic scan (magnetic resonance imaging, MRI), or bone scan.  TREATMENT   Treatment will depend on the cause of your hip pain. Treatment may include:   Limiting activities and resting until symptoms improve.   Crutches or other walking supports (a cane or brace).   Ice, elevation, and compression.   Physical therapy or home exercises.   Shoe inserts or special  shoes.   Losing weight.   Medications to reduce pain.   Undergoing surgery.  HOME CARE INSTRUCTIONS    Only take over-the-counter or prescription medicines for pain, discomfort, or fever as directed by your caregiver.   Put ice on the injured area:   Put ice in a plastic bag.   Place a towel between your skin and the bag.   Leave the ice on for 15-20 minutes at a time, 03-04 times a day.   Keep your leg raised (elevated) when possible to lessen swelling.   Avoid activities that cause pain.   Follow specific exercises as directed by your caregiver.   Sleep with a pillow between your legs on your most comfortable side.   Record how often you have hip pain, the location of the pain, and what it feels like. This information may be helpful to you and your caregiver.   Ask your caregiver about returning to work or sports and whether you should drive.   Follow up with your caregiver for further exams, therapy, or testing as directed.  SEEK MEDICAL CARE IF:    Your pain or swelling continues or worsens after 1 week.   You are feeling unwell or have chills.   You have increasing difficulty with walking.   You have a loss of sensation or other new symptoms.   You have questions or concerns.  SEEK   IMMEDIATE MEDICAL CARE IF:    You cannot put weight on the affected hip.   You have fallen.   You have a sudden increase in pain and swelling in your hip.   You have a fever.  MAKE SURE YOU:    Understand these instructions.   Will watch your condition.   Will get help right away if you are not doing well or get worse.  Document Released: 08/22/2009 Document Revised: 05/27/2011 Document Reviewed: 08/22/2009  [employer] Patient Information 2014 [employer]

## 2013-08-05 NOTE — Progress Notes (Signed)
Subjective:     Patient ID: Annette Short, female   DOB: Jan 27, 1934, 80 y.o.   MRN: [id number]  HPI Pt seen today with R hip pain States pain runs from the back all of the the way to the R knee On occasion the leg will feel like it is giving way She denies any injury but states Dr Joya Salm did surgery on her R hip years ago Unable to tell me what kind of surgery She is using Aleeve for sx   Review of Systems  Musculoskeletal: Positive for arthralgias, gait problem and myalgias.       Sx are to the R hip leg only       Objective:   Physical Exam Exam show midline surg scar at L-spine No surg areas noted to the R hip + TTP R L-spine and R post  hip FROM of the hip No sx with int/ext rotation or abd/adduction Good strength distal Sensory intact Trace distal edema Xray L-spine- Sig degen changes to Lspine and L hip    Assessment:     Back pain with R radicular hip/leg pain    Plan:     Informed pt she should not be taking NSAIDS if on Coumadin Heat/Ice OTC Tylenol Arthritis for sx F/U prn

## 2013-08-10 ENCOUNTER — Ambulatory Visit (INDEPENDENT_AMBULATORY_CARE_PROVIDER_SITE_OTHER): Payer: Medicare Other | Admitting: *Deleted

## 2013-08-10 DIAGNOSIS — Z5181 Encounter for therapeutic drug level monitoring: Secondary | ICD-10-CM

## 2013-08-10 DIAGNOSIS — Z7901 Long term (current) use of anticoagulants: Secondary | ICD-10-CM

## 2013-08-10 DIAGNOSIS — Z954 Presence of other heart-valve replacement: Secondary | ICD-10-CM

## 2013-08-10 DIAGNOSIS — I059 Rheumatic mitral valve disease, unspecified: Secondary | ICD-10-CM

## 2013-08-10 LAB — POCT INR: INR: 5.2

## 2013-08-20 ENCOUNTER — Ambulatory Visit (INDEPENDENT_AMBULATORY_CARE_PROVIDER_SITE_OTHER): Payer: Medicare Other | Admitting: *Deleted

## 2013-08-20 DIAGNOSIS — Z7901 Long term (current) use of anticoagulants: Secondary | ICD-10-CM

## 2013-08-20 DIAGNOSIS — Z954 Presence of other heart-valve replacement: Secondary | ICD-10-CM

## 2013-08-20 DIAGNOSIS — Z5181 Encounter for therapeutic drug level monitoring: Secondary | ICD-10-CM

## 2013-08-20 DIAGNOSIS — I059 Rheumatic mitral valve disease, unspecified: Secondary | ICD-10-CM

## 2013-08-20 LAB — POCT INR: INR: 3.9

## 2013-08-23 ENCOUNTER — Encounter: Payer: Medicare Other | Admitting: *Deleted

## 2013-08-23 ENCOUNTER — Telehealth: Payer: Self-pay | Admitting: Cardiology

## 2013-08-23 NOTE — Telephone Encounter (Signed)
Spoke with pt and reminded pt of remote transmission that is due today. Pt verbalized understanding.   

## 2013-08-24 ENCOUNTER — Telehealth: Payer: Self-pay | Admitting: Cardiology

## 2013-08-24 ENCOUNTER — Encounter: Payer: Self-pay | Admitting: Cardiology

## 2013-08-24 NOTE — Telephone Encounter (Signed)
Pt called and asked if we received her remote transmission. I informed her that we did not receive transmission. She informed me her sister was coming back to help with the transmission this afternoon. I told her to call office first at [phone] and if she did not get anyone at the office to call the [facility] customer support number. Pt verbalized understanding.

## 2013-08-30 ENCOUNTER — Other Ambulatory Visit: Payer: Self-pay

## 2013-08-30 MED ORDER — DIGOXIN 125 MCG PO TABS: 0.1250 mg | ORAL_TABLET | Freq: Every day | ORAL | Status: AC

## 2013-09-03 ENCOUNTER — Other Ambulatory Visit: Payer: Self-pay | Admitting: Cardiology

## 2013-09-10 ENCOUNTER — Ambulatory Visit (INDEPENDENT_AMBULATORY_CARE_PROVIDER_SITE_OTHER): Payer: Medicare Other | Admitting: *Deleted

## 2013-09-10 DIAGNOSIS — Z7901 Long term (current) use of anticoagulants: Secondary | ICD-10-CM

## 2013-09-10 DIAGNOSIS — Z954 Presence of other heart-valve replacement: Secondary | ICD-10-CM

## 2013-09-10 DIAGNOSIS — I059 Rheumatic mitral valve disease, unspecified: Secondary | ICD-10-CM

## 2013-09-10 DIAGNOSIS — Z5181 Encounter for therapeutic drug level monitoring: Secondary | ICD-10-CM

## 2013-09-10 LAB — PROTIME-INR

## 2013-09-10 LAB — POCT INR: INR: >8

## 2013-09-14 ENCOUNTER — Ambulatory Visit (INDEPENDENT_AMBULATORY_CARE_PROVIDER_SITE_OTHER): Payer: Medicare Other | Admitting: *Deleted

## 2013-09-14 ENCOUNTER — Ambulatory Visit (INDEPENDENT_AMBULATORY_CARE_PROVIDER_SITE_OTHER): Payer: Medicare Other | Admitting: Nurse Practitioner

## 2013-09-14 VITALS — BP 146/64 | HR 93 | Temp 96.9°F | Ht 68.0 in | Wt 135.0 lb

## 2013-09-14 DIAGNOSIS — N3000 Acute cystitis without hematuria: Secondary | ICD-10-CM

## 2013-09-14 DIAGNOSIS — I059 Rheumatic mitral valve disease, unspecified: Secondary | ICD-10-CM

## 2013-09-14 DIAGNOSIS — R17 Unspecified jaundice: Secondary | ICD-10-CM

## 2013-09-14 DIAGNOSIS — N3001 Acute cystitis with hematuria: Secondary | ICD-10-CM

## 2013-09-14 DIAGNOSIS — Z7901 Long term (current) use of anticoagulants: Secondary | ICD-10-CM

## 2013-09-14 DIAGNOSIS — R319 Hematuria, unspecified: Secondary | ICD-10-CM

## 2013-09-14 DIAGNOSIS — N390 Urinary tract infection, site not specified: Secondary | ICD-10-CM

## 2013-09-14 DIAGNOSIS — R634 Abnormal weight loss: Secondary | ICD-10-CM

## 2013-09-14 DIAGNOSIS — Z5181 Encounter for therapeutic drug level monitoring: Secondary | ICD-10-CM

## 2013-09-14 DIAGNOSIS — Z954 Presence of other heart-valve replacement: Secondary | ICD-10-CM

## 2013-09-14 LAB — POCT CBC
Granulocyte percent: 80.9 %G — AB (ref 37–80)
HEMATOCRIT: 40.2 % (ref 37.7–47.9)
HEMOGLOBIN: 13 g/dL (ref 12.2–16.2)
Lymph, poc: 1.3 (ref 0.6–3.4)
MCH: 29.6 pg (ref 27–31.2)
MCHC: 32.3 g/dL (ref 31.8–35.4)
MCV: 91.6 fL (ref 80–97)
MPV: 8.3 fL (ref 0–99.8)
POC GRANULOCYTE: 7.1 — AB (ref 2–6.9)
POC LYMPH PERCENT: 14.7 %L (ref 10–50)
Platelet Count, POC: 277 10*3/uL (ref 142–424)
RBC: 4.4 M/uL (ref 4.04–5.48)
RDW, POC: 13.7 %
WBC: 8.8 10*3/uL (ref 4.6–10.2)

## 2013-09-14 LAB — POCT INR: INR: 1.8

## 2013-09-14 LAB — POCT URINALYSIS DIPSTICK

## 2013-09-14 LAB — POCT UA - MICROSCOPIC ONLY

## 2013-09-14 MED ORDER — CIPROFLOXACIN HCL 500 MG PO TABS: 500.0000 mg | ORAL_TABLET | Freq: Two times a day (BID) | ORAL | Status: AC

## 2013-09-14 NOTE — Patient Instructions (Signed)

## 2013-09-14 NOTE — Progress Notes (Signed)
   Subjective:    Patient ID: Annette Short, female    DOB: 12-10-1933, 80 y.o.   MRN: [id number]  Urinary Tract Infection  This is a new problem. The current episode started in the past 7 days (hematuria). The patient is experiencing no pain. There has been no fever. Associated symptoms include hematuria. She has tried nothing for the symptoms.      Review of Systems  Constitutional: Negative.   HENT: Negative.   Eyes: Negative.   Respiratory: Negative.   Cardiovascular: Negative.        Mechanical valve.   Gastrointestinal: Negative.   Endocrine: Negative.   Genitourinary: Positive for hematuria.  Musculoskeletal: Positive for back pain and gait problem.  Skin: Negative.   Allergic/Immunologic: Negative.   Neurological: Negative.   Hematological: Negative.   Psychiatric/Behavioral: Negative.        Objective:   Physical Exam  Constitutional: She appears well-developed and well-nourished.  HENT:  Head: Normocephalic.  Eyes: Pupils are equal, round, and reactive to light.  Cardiovascular: Normal rate.   Pulmonary/Chest: Effort normal.  Abdominal: Soft. She exhibits no distension. There is no tenderness.  Genitourinary:  No CVA tenderness  Musculoskeletal:  Uses a cane.   Neurological: She is alert.  Skin: Skin is warm.  Slightly jaundice Knotty lesion all over body   BP 146/64  Pulse 93  Temp(Src) 96.9 F (36.1 C) (Oral)  Ht $R'5\' 8"'WU$  (1.727 m)  Wt 135 lb (61.236 kg)  BMI 20.53 kg/m2  . Results for orders placed in visit on 09/14/13  POCT URINALYSIS DIPSTICK      Result Value Ref Range   Color, UA turbis     Clarity, UA turbid    POCT UA - MICROSCOPIC ONLY      Result Value Ref Range   RBC, urine, microscopic TNTC    POCT CBC      Result Value Ref Range   WBC 8.8  4.6 - 10.2 K/uL   Lymph, poc 1.3  0.6 - 3.4   POC LYMPH PERCENT 14.7  10 - 50 %L   POC Granulocyte 7.1 (*) 2 - 6.9   Granulocyte percent 80.9 (*) 37 - 80 %G   RBC 4.4  4.04 - 5.48 M/uL   Hemoglobin 13.0  12.2 - 16.2 g/dL   HCT, POC 40.2  37.7 - 47.9 %   MCV 91.6  80 - 97 fL   MCH, POC 29.6  27 - 31.2 pg   MCHC 32.3  31.8 - 35.4 g/dL   RDW, POC 13.7     Platelet Count, POC 277.0  142 - 424 K/uL   MPV 8.3  0 - 99.8 fL        Assessment & Plan:  1. Jaundice/weight loss Labs pending - CMP14+EGFR - Lipase - Amylase - Anemia Profile B  2. Blood in urine - POCT urinalysis dipstick - POCT UA - Microscopic Only - POCT CBC  3. Urinary tract infection with hematuria, site unspecified Force fluids AZO over the counter X2 days RTO prn Culture pending  - ciprofloxacin (CIPRO) 500 MG tablet; Take 1 tablet (500 mg total) by mouth 2 (two) times daily.  Dispense: 20 tablet; Refill: 0 - Urine culture  4. Acute cystitis with hematuria   Mary-Margaret Hassell Done, FNP

## 2013-09-15 LAB — ANEMIA PROFILE B
Basophils Absolute: 0 10*3/uL (ref 0.0–0.2)
Basos: 0 %
Eos: 0 %
Eosinophils Absolute: 0 10*3/uL (ref 0.0–0.4)
FERRITIN: 586 ng/mL — AB (ref 15–150)
FOLATE: 18.8 ng/mL (ref >3.0)
HCT: 36.9 % (ref 34.0–46.6)
Hemoglobin: 12.2 g/dL (ref 11.1–15.9)
Immature Grans (Abs): 0 10*3/uL (ref 0.0–0.1)
Immature Granulocytes: 0 %
Iron Saturation: 25 % (ref 15–55)
Iron: 72 ug/dL (ref 35–155)
Lymphocytes Absolute: 1 10*3/uL (ref 0.7–3.1)
Lymphs: 13 %
MCH: 30.7 pg (ref 26.6–33.0)
MCHC: 33.1 g/dL (ref 31.5–35.7)
MCV: 93 fL (ref 79–97)
MONOCYTES: 6 %
Monocytes Absolute: 0.4 10*3/uL (ref 0.1–0.9)
NEUTROS ABS: 6.5 10*3/uL (ref 1.4–7.0)
Neutrophils Relative %: 81 %
Platelets: 317 10*3/uL (ref 150–379)
RBC: 3.98 x10E6/uL (ref 3.77–5.28)
RDW: 14.1 % (ref 12.3–15.4)
RETIC CT PCT: 1 % (ref 0.6–2.6)
TIBC: 293 ug/dL (ref 250–450)
UIBC: 221 ug/dL (ref 150–375)
Vitamin B-12: 269 pg/mL (ref 211–946)
WBC: 8 10*3/uL (ref 3.4–10.8)

## 2013-09-15 LAB — CMP14+EGFR
ALK PHOS: 83 IU/L (ref 39–117)
ALT: 14 IU/L (ref 0–32)
AST: 24 IU/L (ref 0–40)
Albumin/Globulin Ratio: 1.7 (ref 1.1–2.5)
Albumin: 4.3 g/dL (ref 3.5–4.8)
BILIRUBIN TOTAL: 5.2 mg/dL — AB (ref 0.0–1.2)
BUN / CREAT RATIO: 15 (ref 11–26)
BUN: 20 mg/dL (ref 8–27)
CO2: 23 mmol/L (ref 18–29)
Calcium: 9.5 mg/dL (ref 8.7–10.3)
Chloride: 95 mmol/L — ABNORMAL LOW (ref 97–108)
Creatinine, Ser: 1.33 mg/dL — ABNORMAL HIGH (ref 0.57–1.00)
GFR, EST AFRICAN AMERICAN: 44 mL/min/{1.73_m2} — AB (ref >59)
GFR, EST NON AFRICAN AMERICAN: 38 mL/min/{1.73_m2} — AB (ref >59)
GLUCOSE: 123 mg/dL — AB (ref 65–99)
Globulin, Total: 2.5 g/dL (ref 1.5–4.5)
POTASSIUM: 4.1 mmol/L (ref 3.5–5.2)
Sodium: 139 mmol/L (ref 134–144)
TOTAL PROTEIN: 6.8 g/dL (ref 6.0–8.5)

## 2013-09-15 LAB — AMYLASE: Amylase: 46 U/L (ref 31–124)

## 2013-09-15 LAB — LIPASE: Lipase: 40 U/L (ref 0–59)

## 2013-09-16 LAB — URINE CULTURE: ORGANISM ID, BACTERIA: NO GROWTH

## 2013-09-21 ENCOUNTER — Other Ambulatory Visit: Payer: Self-pay

## 2013-09-22 ENCOUNTER — Telehealth: Payer: Self-pay | Admitting: *Deleted

## 2013-09-22 NOTE — Telephone Encounter (Signed)
Ok to refill paxil for patient or defer to pcp? Please advise. Thanks, MI

## 2013-09-23 NOTE — Telephone Encounter (Signed)
Defer to PCP please

## 2013-09-23 NOTE — Telephone Encounter (Signed)
Patient deceased. Pharmacy aware.

## 2013-10-16 DEATH — deceased

## 2014-02-24 ENCOUNTER — Encounter (HOSPITAL_COMMUNITY): Payer: Self-pay | Admitting: Internal Medicine

## 2014-08-07 IMAGING — CR DG LUMBAR SPINE 2-3V
2 series · 2 of 2 positions shown · non-contrast
Comparison: None.

CLINICAL DATA: Back pain with right leg pain

EXAM:
LUMBAR SPINE - 2-3 VIEW

[view not recorded (1 of 2)]
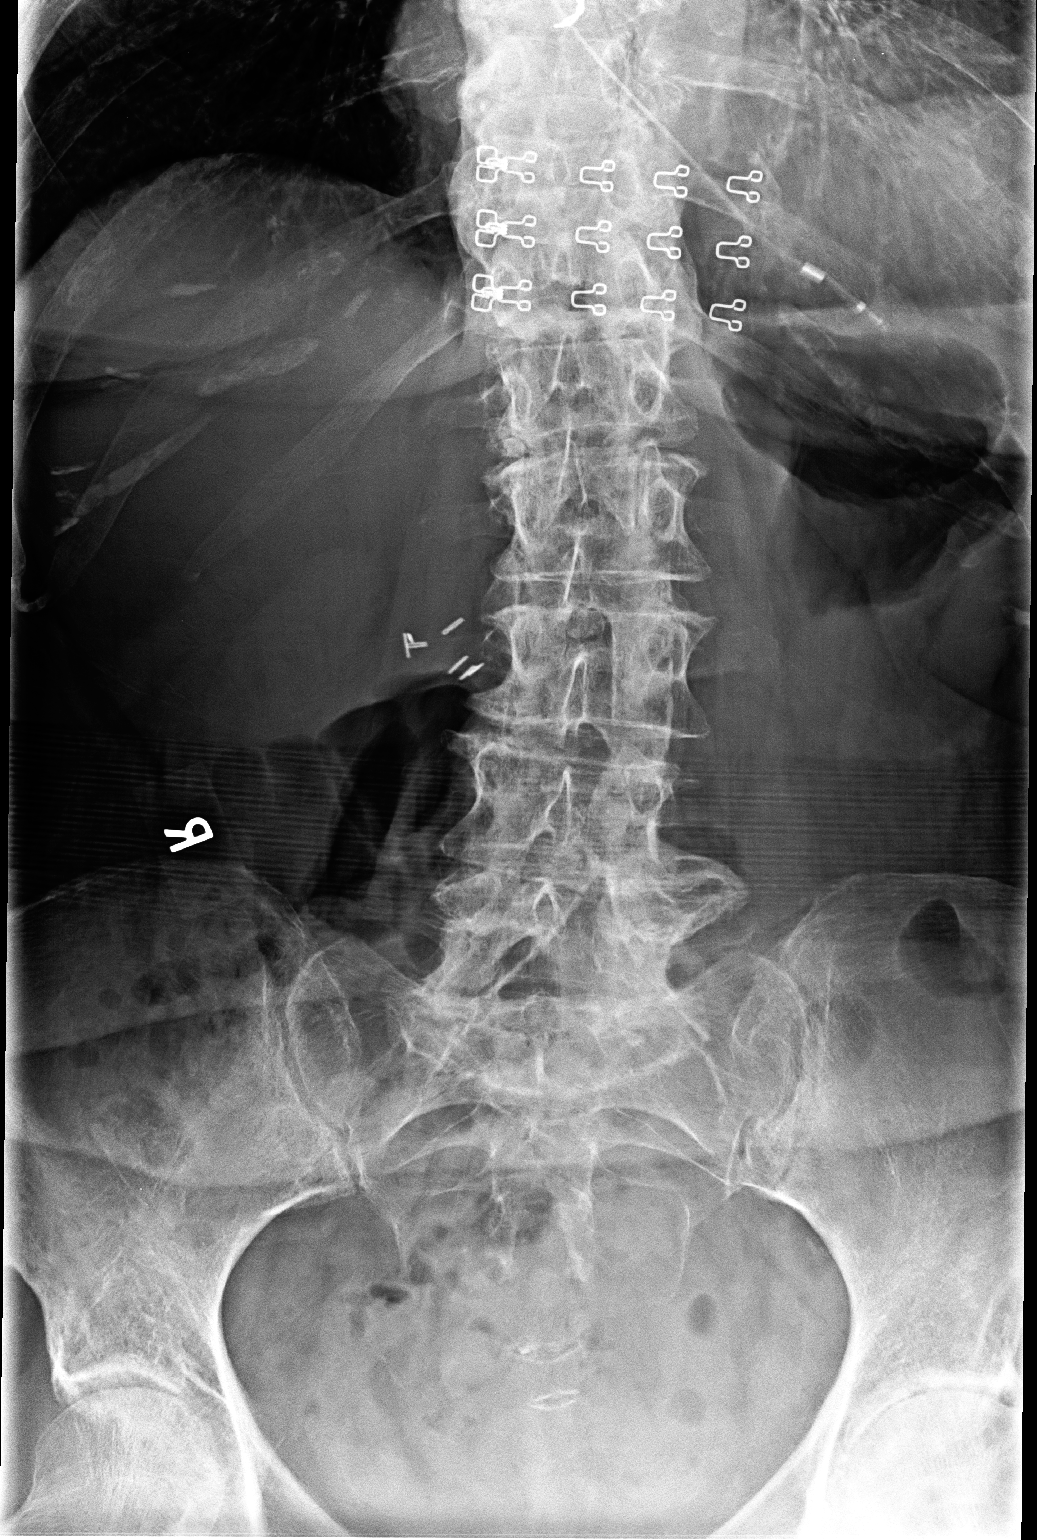

[view not recorded (2 of 2)]
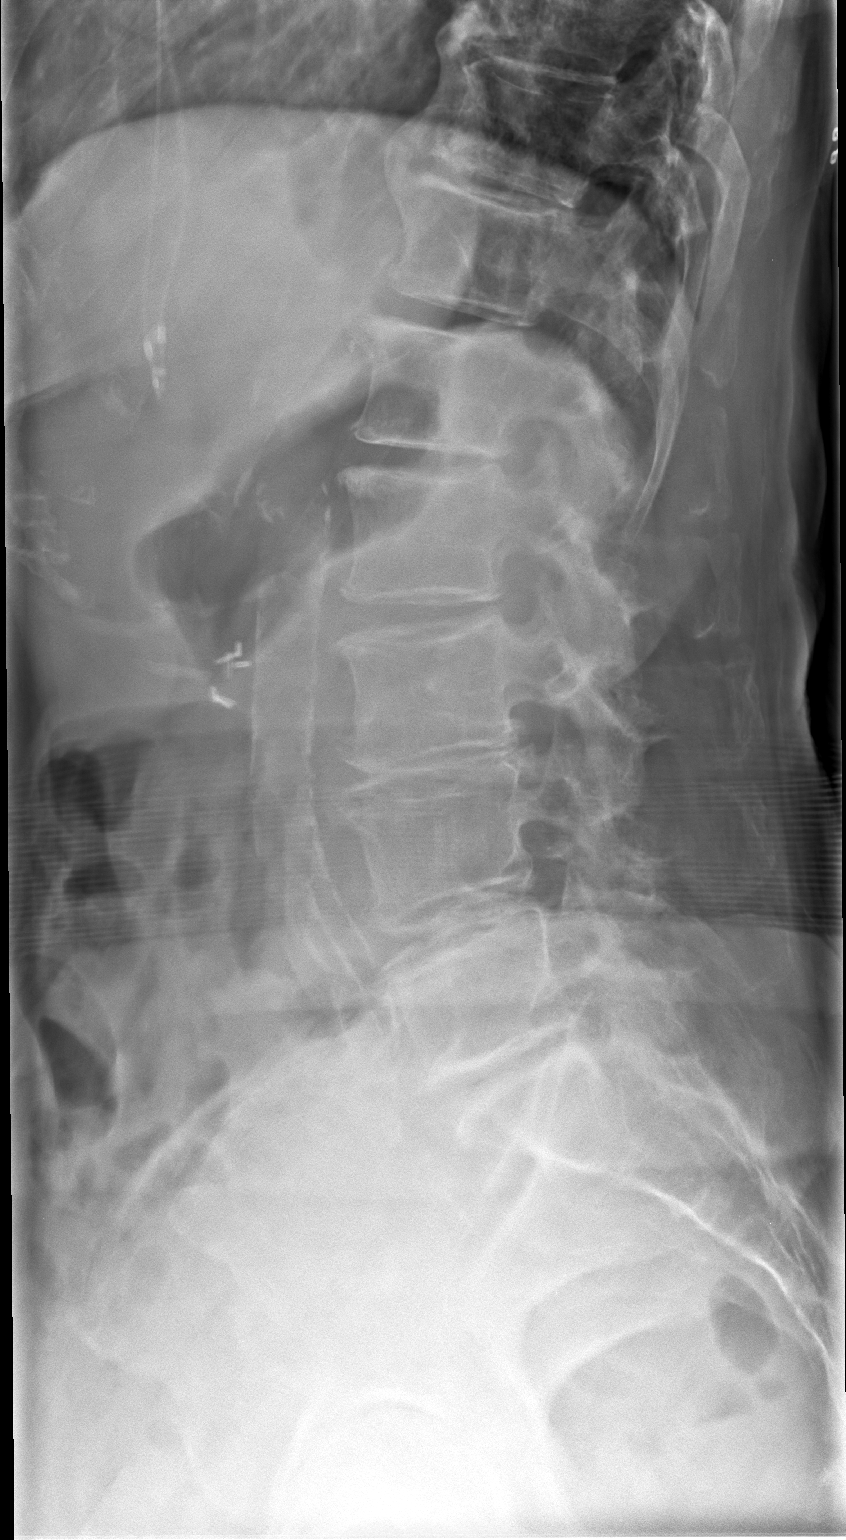

[2 of 2 positions shown; findings below may reference images not displayed]

FINDINGS: Mild levoscoliosis. Normal sagittal alignment. Negative for
fracture. Disc degeneration and spondylosis throughout the lumbar
spine, mild to moderate in degree. Negative for pars defect. Aortic
calcification without aneurysm. Surgical clips in the gallbladder
fossa.
IMPRESSION: Mild to moderate spondylosis.  No acute abnormality.  Levoscoliosis.
